# Patient Record
Sex: Male | Born: 1995 | Hispanic: No | Marital: Single | Smoking: Never smoker
Health system: Southern US, Community
[De-identification: ages and names within clinical notes are randomized; demographics above are authoritative.]

## PROBLEM LIST (undated history)

## (undated) DIAGNOSIS — J45990 Exercise induced bronchospasm: Principal | ICD-10-CM

## (undated) DIAGNOSIS — L409 Psoriasis, unspecified: Secondary | ICD-10-CM

## (undated) DIAGNOSIS — M545 Low back pain: Secondary | ICD-10-CM

## (undated) DIAGNOSIS — M4000 Postural kyphosis, site unspecified: Secondary | ICD-10-CM

## (undated) DIAGNOSIS — J45909 Unspecified asthma, uncomplicated: Secondary | ICD-10-CM

## (undated) HISTORY — PX: HX APPENDECTOMY: SHX54

## (undated) HISTORY — DX: Unspecified asthma, uncomplicated: J45.909

## (undated) HISTORY — DX: Exercise induced bronchospasm: J45.990

## (undated) HISTORY — DX: Psoriasis, unspecified: L40.9

## (undated) HISTORY — DX: Low back pain: M54.5

## (undated) HISTORY — DX: Postural kyphosis, site unspecified: M40.00

---
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
## 2014-07-26 ENCOUNTER — Ambulatory Visit (INDEPENDENT_AMBULATORY_CARE_PROVIDER_SITE_OTHER): Payer: No Typology Code available for payment source

## 2014-07-26 ENCOUNTER — Encounter (INDEPENDENT_AMBULATORY_CARE_PROVIDER_SITE_OTHER): Payer: Self-pay

## 2014-07-26 VITALS — BP 140/80 | HR 77 | Temp 97.1°F | Resp 18 | Ht 71.0 in | Wt 196.7 lb

## 2014-07-26 DIAGNOSIS — J40 Bronchitis, not specified as acute or chronic: Principal | ICD-10-CM

## 2014-07-26 MED ORDER — ALBUTEROL SULFATE HFA 90 MCG/ACTUATION AEROSOL INHALER
2.00 | INHALATION_SPRAY | Freq: Four times a day (QID) | RESPIRATORY_TRACT | Status: DC
Start: 2014-07-26 — End: 2014-10-26

## 2014-07-26 MED ORDER — AZITHROMYCIN 250 MG TABLET
ORAL_TABLET | ORAL | Status: DC
Start: 2014-07-26 — End: 2014-10-26

## 2014-07-26 NOTE — Progress Notes (Addendum)
History of Present Illness: Kirk Gonzalez is a 19 y.o. male who [facility] today with chief complaint of    Chief Complaint     Cold Symptoms no relief with OTC    Cough           Pt reports having a cold x 1 month. Pt's symptoms include a productive cough, chest congestion, nasal congestion, and mild SOB. Pt states that this productive cough has improved some but he still has moderate chest congestion. Denies fever, chills, sore throat, ear pain, wheezing, and rash. Pt has been using Vicks Vapor rub, Dayquil, and Nyquil without relief.    Location: Pulmonary  Quality: chest congestion  Onset: 1 month  Severity: mild  Timing: continuing  Context: NKDA. 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 H/o bronchitis.    I reviewed and confirmed the patient's past medical history taken by the nurse or medical assistant with the addition of the following:    Past Medical History:    Past Medical History   Diagnosis Date    Asthma          Past Surgical History:    Past Surgical History   Procedure Laterality Date    Hx appendectomy           Allergies:  No Known Allergies  Medications:    Current Outpatient Prescriptions   Medication Sig    albuterol sulfate (PROVENTIL OR VENTOLIN OR PROAIR) 90 mcg/actuation Inhalation HFA Aerosol Inhaler Take 2 Puffs by inhalation Four times a day    azithromycin (ZITHROMAX) 250 mg Oral Tablet 2 tablets day 1, then 1 tablet daily     Social History:    History   Substance Use Topics    Smoking status: Never Smoker     Smokeless tobacco: Never Used    Alcohol Use: 0.0 oz/week     0 Standard drinks or equivalent per week     Family History: No significant family history.  Family History   Problem Relation Age of Onset    Healthy Mother     Healthy Father          Review of Systems:    General: no fever and no chills  ENT:  Nasal congestion, no sore throat, no otalgia right, and no otalgia left  Pulmonary:   productive cough, chest  congestion, mild SOB, and no wheezing  Skin: no rashes  All other review of systems were negative    Physical Exam:  Vital signs:   Filed Vitals:    07/26/14 1625   BP: 140/80   Pulse: 77   Temp: 36.2 C (97.1 F)   TempSrc: Thermal Scan   Resp: 18   Height: 1.803 m (5\' 11" )   Weight: 89.2 kg (196 lb 10.4 oz)   SpO2: 96%     Body mass index is 27.44 kg/(m^2). 91%ile (Z=1.33) based on CDC 2-20 Years weight-for-age data using vitals from 07/26/2014.  No LMP for male patient.    General:  No acute distress, appears comfortable  Head:  Normocephalic  Eyes:  Normal lids/lashes and normal conjunctiva  ENT:  normal EAC's, normal TM's, MMM, normal oropharynx, no tonsillar exudate or enlargement, normal tongue/uvula  Neck:  supple  Pulmonary:  slight wheezes in the left base, no rales and no rhonchi  Cardiovascular:  regular rate/rhythm and no murmur/rub/gallop  Skin:  warm/dry and no visible rash to exposed skin  Psychiatric:  Appropriate affect and  behavior  Neurologic:   Alert and oriented  Hem/Lymph:  No cervical lymphadenopathy or tenderness     Data Reviewed:    Not applicable    Course: Condition at discharge: Good     Differential Diagnosis: URI v bronchitis v pneumonia     Assessment:   1. Bronchitis        Plan:    Orders Placed This Encounter    azithromycin (ZITHROMAX) 250 mg Oral Tablet    albuterol sulfate (PROVENTIL OR VENTOLIN OR PROAIR) 90 mcg/actuation Inhalation HFA Aerosol Inhaler        Educated on Abx usage.  Pt can use inhaler up to 4 x per day.  If sxs persist past 2-4 weeks RTC for further evaluation.    Go to Emergency Department immediately for further work up if any concerning symptoms.  Plan was discussed and patient verbalized understanding.  If symptoms are worsening or not improving the patient should return for further evaluation.    I am scribing for, and in the presence of, Dr. Margaret Pyle, MD, for services provided on 07/26/2014.  Morgan Stanley, SCRIBE     Morgan Stanley, SCRIBE  07/26/2014, 16:38      I personally performed the services described in this documentation, as scribed  in my presence, and it is both accurate  and complete.    Margaret Pyle, MD

## 2014-07-26 NOTE — Progress Notes (Signed)
Patient has given verbal permission for the student scribe to assist the healthcare provider during the patients visit.Kirk HardingBrent Meganne Rita, LPN 1/61/09607/15/2016, 45:[id number]

## 2014-07-26 NOTE — Patient Instructions (Signed)
[facility]     Operated by [facility]  [address]  [place], [place] [id number]  Phone: [phone]  Fax: [phone]  [email or web address]  [email or web address]   Closed all  Holidays        Attending [facility]    Today's orders:   Orders Placed This Encounter    azithromycin (ZITHROMAX) 250 mg Oral Tablet    albuterol sulfate (PROVENTIL OR VENTOLIN OR PROAIR) 90 mcg/actuation Inhalation HFA Aerosol Inhaler         Prescription(s) E-Rx to:  [id number] - [place], [place] - [address]    Future appts with Student [facility]  Visit date not found    PLEASE ACTIVATE YOUR [place] MYCHART. THIS IS ONE EASY WAY TO COMMUNICATE [facility].  SEE THE CODE ON THIS FORM FOR ACCESS.    -----------------------------PRIVACY INFORMATION-----------------------------------------------  As a [employer] student, regardless of your age, we cannot discuss your personal health information with a parent, spouse, family member or anyone else without your expressed consent.    This policy does not include:  Individuals who would have a legitimate reason to access your records and information to assist in your care under the provisions of HIPAA [facility](Health Insurance Portability and Accountability Act) law;   Individuals with whom you have previously given expressed written consent to do so, such a legal guardian or Power of [address].    No one can access your [facility], unless you give them your account sign on and password. You will receive emails from [facility].  This means anyone who has access to the email account you provided can see this notification. There will be no private medical information included in these emails. This notification of  new medical information  available in your [facility], may be information that you do not want others to know.   _______________________________________________________________________    If your  symptoms persist, worsen or you develop any new or concerning symptoms please call [facility] for at [phone] for a follow up appointment.   If your symptoms are severe, go immediately to the emergency department or call 911.  Please check with your health insurance coverage for these types of visits.   Please keep all follow up visit recommended at today's visit.    If you received x-rays during your visit, be aware that the final and formal interpretation of those films by a radiologist will occur after your discharge.  If there is a significant discrepancy identified after your discharge, we will contact you at the telephone number provided during registration.  These results are available for your review on [facility].    Please refer to your [facility] for lab test results. Lab test results related to HIV, STI's and pregnancy are considered private and are therefore not immediately visible in your [facility], we may still be able to send a generic MyChart message for these results.  If you have cultures pending for sexually transmitted diseases, you will be contacted by phone.     Positive cultures are reported to the [facility] Department of Health, as required by state law. These results are considered private on My[facility] and can only be released by the provider.We will not contact you if the results are negative.    It is very important that we have a phone number that is the single best way to contact you in the event that we become aware of important clinical information or concerns after your discharge.  If the phone number you provided at registration is NOT this number you should inform staff and registration prior to leaving.     Please call [facility] at [phone] with any further questions.    Instructions discussed with patient upon discharge by clinical staff with all questions answered.    Margaret PyleWilliam Audrie Kuri, MD 07/26/2014, 16:37        Bronchitis  Bronchitis is inflammation of the  airways that extend from the windpipe into the lungs (bronchi). The inflammation often causes mucus to develop, which leads to a cough. If the inflammation becomes severe, it may cause shortness of breath.  CAUSES   Bronchitis may be caused by:   1. Viral infections.   2. Bacteria.   3. Cigarette smoke.   4. Allergens, pollutants, and other irritants.   SIGNS AND SYMPTOMS   The most common symptom of bronchitis is a frequent cough that produces mucus. Other symptoms include:  1. Fever.   2. Body aches.   3. Chest congestion.   4. Chills.   5. Shortness of breath.   6. Sore throat.   DIAGNOSIS   Bronchitis is usually diagnosed through a medical history and physical exam. Tests, such as chest X-rays, are sometimes done to rule out other conditions.   TREATMENT   You may need to avoid contact with whatever caused the problem (smoking, for example). Medicines are sometimes needed. These may include:   Antibiotics. These may be prescribed if the condition is caused by bacteria.   Cough suppressants. These may be prescribed for relief of cough symptoms.    Inhaled medicines. These may be prescribed to help open your airways and make it easier for you to breathe.    Steroid medicines. These may be prescribed for those with recurrent (chronic) bronchitis.  HOME CARE INSTRUCTIONS   Get plenty of rest.    Drink enough fluids to keep your urine clear or pale yellow (unless you have a medical condition that requires fluid restriction). Increasing fluids may help thin your secretions and will prevent dehydration.    Only take over-the-counter or prescription medicines as directed by your health care provider.   Only take antibiotics as directed. Make sure you finish them even if you start to feel better.   Avoid secondhand smoke, irritating chemicals, and strong fumes. These will make bronchitis worse. If you are a smoker, quit smoking. Consider using nicotine gum or skin patches to help control withdrawal  symptoms. Quitting smoking will help your lungs heal faster.    Put a cool-mist humidifier in your bedroom at night to moisten the air. This may help loosen mucus. Change the water in the humidifier daily. You can also run the hot water in your shower and sit in the bathroom with the door closed for 5-10 minutes.    Follow up with your health care provider as directed.    Wash your hands frequently to avoid catching bronchitis again or spreading an infection to others.   SEEK MEDICAL CARE IF:  Your symptoms do not improve after 1 week of treatment.   SEEK IMMEDIATE MEDICAL CARE IF:   Your fever increases.   You have chills.    You have chest pain.    You have worsening shortness of breath.    You have bloody sputum.   You faint.   You have lightheadedness.   You have a severe headache.    You vomit repeatedly.  MAKE SURE YOU:    Understand these  instructions.   Will watch your condition.   Will get help right away if you are not doing well or get worse.  Document Released: 12/28/2004 Document Revised: 10/18/2012 Document Reviewed: 08/22/2012  [facility] Patient Information 2015 [place], [place]. This information is not intended to replace advice given to you by your health care provider. Make sure you discuss any questions you have with your health care provider.  How to Use an Inhaler  Proper inhaler technique is very important. Good technique ensures that the medicine reaches the lungs. Poor technique results in depositing the medicine on the tongue and back of the throat rather than in the airways. If you do not use the inhaler with good technique, the medicine will not help you.  STEPS TO FOLLOW IF USING AN INHALER WITHOUT AN EXTENSION TUBE  5. Remove the cap from the inhaler.  6. If you are using the inhaler for the first time, you will need to prime it. Shake the inhaler for 5 seconds and release four puffs into the air, away from your face. Ask your health care provider or pharmacist if  you have questions about priming your inhaler.  7. Shake the inhaler for 5 seconds before each breath in (inhalation).  8. Position the inhaler so that the top of the canister faces up.  9. Put your index finger on the top of the medicine canister. Your thumb supports the bottom of the inhaler.  10. Open your mouth.  11. Either place the inhaler between your teeth and place your lips tightly around the mouthpiece, or hold the inhaler 1-2 inches away from your open mouth. If you are unsure of which technique to use, ask your health care provider.  12. Breathe out (exhale) normally and as completely as possible.  13. Press the canister down with your index finger to release the medicine.  14. At the same time as the canister is pressed, inhale deeply and slowly until your lungs are completely filled. This should take 4-6 seconds. Keep your tongue down.  15. Hold the medicine in your lungs for 5-10 seconds (10 seconds is best). This helps the medicine get into the small airways of your lungs.  16. Breathe out slowly, through pursed lips. Whistling is an example of pursed lips.  17. Wait at least 15-30 seconds between puffs. Continue with the above steps until you have taken the number of puffs your health care provider has ordered. Do not use the inhaler more than your health care provider tells you.  18. Replace the cap on the inhaler.  19. Follow the directions from your health care provider or the inhaler insert for cleaning the inhaler.  STEPS TO FOLLOW IF USING AN INHALER WITH AN EXTENSION (SPACER)  7. Remove the cap from the inhaler.  8. If you are using the inhaler for the first time, you will need to prime it. Shake the inhaler for 5 seconds and release four puffs into the air, away from your face. Ask your health care provider or pharmacist if you have questions about priming your inhaler.  9. Shake the inhaler for 5 seconds before each breath in (inhalation).  10. Place the open end of the spacer onto the  mouthpiece of the inhaler.  11. Position the inhaler so that the top of the canister faces up and the spacer mouthpiece faces you.  12. Put your index finger on the top of the medicine canister. Your thumb supports the bottom of the inhaler and the spacer.  13. Breathe out (exhale) normally and as completely as possible.  14. Immediately after exhaling, place the spacer between your teeth and into your mouth. Close your lips tightly around the spacer.  15. Press the canister down with your index finger to release the medicine.  16. At the same time as the canister is pressed, inhale deeply and slowly until your lungs are completely filled. This should take 4-6 seconds. Keep your tongue down and out of the way.  17. Hold the medicine in your lungs for 5-10 seconds (10 seconds is best). This helps the medicine get into the small airways of your lungs. Exhale.  18. Repeat inhaling deeply through the spacer mouthpiece. Again hold that breath for up to 10 seconds (10 seconds is best). Exhale slowly. If it is difficult to take this second deep breath through the spacer, breathe normally several times through the spacer. Remove the spacer from your mouth.  19. Wait at least 15-30 seconds between puffs. Continue with the above steps until you have taken the number of puffs your health care provider has ordered. Do not use the inhaler more than your health care provider tells you.  20. Remove the spacer from the inhaler, and place the cap on the inhaler.  21. Follow the directions from your health care provider or the inhaler insert for cleaning the inhaler and spacer.  If you are using different kinds of inhalers, use your quick relief medicine to open the airways 10-15 minutes before using a steroid if instructed to do so by your health care provider. If you are unsure which inhalers to use and the order of using them, ask your health care provider, nurse, or respiratory therapist.  If you are using a steroid inhaler,  always rinse your mouth with water after your last puff, then gargle and spit out the water. Do not swallow the water.  AVOID:   Inhaling before or after starting the spray of medicine. It takes practice to coordinate your breathing with triggering the spray.   Inhaling through the nose (rather than the mouth) when triggering the spray.  HOW TO DETERMINE IF YOUR INHALER IS FULL OR NEARLY EMPTY  You cannot know when an inhaler is empty by shaking it. A few inhalers are now being made with dose counters. Ask your health care provider for a prescription that has a dose counter if you feel you need that extra help. If your inhaler does not have a counter, ask your health care provider to help you determine the date you need to refill your inhaler. Write the refill date on a calendar or your inhaler canister. Refill your inhaler 7-10 days before it runs out. Be sure to keep an adequate supply of medicine. This includes making sure it is not expired, and that you have a spare inhaler.   SEEK MEDICAL CARE IF:    Your symptoms are only partially relieved with your inhaler.   You are having trouble using your inhaler.   You have some increase in phlegm.  SEEK IMMEDIATE MEDICAL CARE IF:    You feel little or no relief with your inhalers. You are still wheezing and are feeling shortness of breath or tightness in your chest or both.   You have dizziness, headaches, or a fast heart rate.   You have chills, fever, or night sweats.   You have a noticeable increase in phlegm production, or there is blood in the phlegm.  MAKE SURE YOU:    Understand  these instructions.   Will watch your condition.   Will get help right away if you are not doing well or get worse.     This information is not intended to replace advice given to you by your health care provider. Make sure you discuss any questions you have with your health care provider.     Document Released: 12/26/1999 Document Revised: 10/18/2012 Document Reviewed:  07/27/2012  Elsevier Interactive Patient Education [employer]

## 2014-10-26 ENCOUNTER — Encounter (INDEPENDENT_AMBULATORY_CARE_PROVIDER_SITE_OTHER): Payer: Self-pay

## 2014-10-26 ENCOUNTER — Ambulatory Visit (INDEPENDENT_AMBULATORY_CARE_PROVIDER_SITE_OTHER): Payer: No Typology Code available for payment source

## 2014-10-26 VITALS — BP 120/77 | HR 85 | Temp 97.4°F | Resp 16 | Ht 71.0 in | Wt 192.8 lb

## 2014-10-26 DIAGNOSIS — J029 Acute pharyngitis, unspecified: Secondary | ICD-10-CM

## 2014-10-26 MED ORDER — AZITHROMYCIN 250 MG TABLET
ORAL_TABLET | ORAL | Status: DC
Start: 2014-10-26 — End: 2014-11-29

## 2014-10-26 NOTE — Patient Instructions (Addendum)
[facility] [employer]      Operated by [facility]  [address] [place], [place] [address]  Phone: [phone])  Fax: [phone]  [email or web address]  Open Daily 8:00a - 8:00p    Closed Thanksgiving and Christmas Day  [facility]     Operated by [facility]  [address]  [facility]  [place], [place] [address]  Phone: [phone]  Fax: [phone]  [email or web address]  Open M-F  8:00a - 8:00p  Sat              10:00a - 4:00p  Sun             Closed  Closed all [place] Holidays        Attending Caregiver: Marcia Brash, NP      Today's orders:   Orders Placed This Encounter    azithromycin (ZITHROMAX) 250 mg Oral Tablet      Gargle with warm salt water.     Prescription(s) E-Rx to:  [facility] - [place],  - [address]    ________________________________________________________________________  Short Term Disability and Family Medical Leave Act  [facility] does NOT provide assistance with any disability applications.  If you feel your medical condition requires you to be on disability, you will need to follow up with  your primary care physician or a specialist.  We apologize for any inconvenience.    For Medication Prescribed by [facility]:  As an Urgent Care facility, our clinic does NOT offer prescription refills over the telephone.    If you need more of the medication one of our medical providers prescribed, you will  either need to be re-evaluated by [place] or see your primary care physician.    ________________________________________________________________________      It is very important that we have a phone number.  This is the single best way to contact you in the event that we become aware of important clinical information or concerns after your discharge.  If the phone number you provided at registration is NOT this number you should inform staff and registration  prior to leaving.      Your treatment and evaluation today was focused on identifying and treating potentially emergent conditions based on your presenting signs, symptoms, and history.  The resulting initial clinical impression and treatment plan is not intended to be definitive or a substitute for a full physical examination and evaluation by your primary care provider.  If your symptoms persist, worsen, or you develop any new or concerning symptoms, you need to be evaluated.      If you received x-rays during your visit, be aware that the final and formal interpretation of those films by a radiologist may occur after your discharge.  If there is a significant discrepancy identified after your discharge, we will contact you at the telephone number provided at registration.      If you received a pelvic exam, you may have cultures pending for sexually transmitted diseases.  Positive cultures are reported to the [facility] Department of Health as required by state law.  You may contact the Health Information Management Office of [facility] to get a copy of your results.     If you are over 56 year old, we cannot discuss your personal health information with a parent, spouse, family member, or anyone else without your consent.  This does not include those who have legitimate access to  your records and information to assist in your care under the provisions of HIPAA [employer](Health Insurance Portability and Accountability Act) law, or those to whom you have previously given written consent to do so, such a legal guardian or Power of [place] HouseAttorney.      Instructions are discussed with patient upon discharge by clinical staff with all questions answered.  Please call [facility] [phone]) if any further questions develop.  Go immediately to the emergency department if any concerns or worsening symptoms.      Marcia BrashAmy Geraldene Eisel, NP 10/26/2014, 12:36    Pharyngitis  Pharyngitis is a sore throat (pharynx). There is redness,  pain, and swelling of your throat.  HOME CARE    Drink enough fluids to keep your pee (urine) clear or pale yellow.   Only take medicine as told by your doctor.   You may get sick again if you do not take medicine as told. Finish your medicines, even if you start to feel better.   Do not take aspirin.   Rest.   Rinse your mouth (gargle) with salt water ( tsp of salt per 1 qt of water) every 1-2 hours. This will help the pain.   If you are not at risk for choking, you can suck on hard candy or sore throat lozenges.  GET HELP IF:   You have large, tender lumps on your neck.   You have a rash.   You cough up green, yellow-brown, or bloody spit.  GET HELP RIGHT AWAY IF:    You have a stiff neck.   You drool or cannot swallow liquids.   You throw up (vomit) or are not able to keep medicine or liquids down.   You have very bad pain that does not go away with medicine.   You have problems breathing (not from a stuffy nose).  MAKE SURE YOU:    Understand these instructions.   Will watch your condition.   Will get help right away if you are not doing well or get worse.     This information is not intended to replace advice given to you by your health care provider. Make sure you discuss any questions you have with your health care provider.     Document Released: 06/16/2007 Document Revised: 10/18/2012 Document Reviewed: 09/04/2012  Elsevier Interactive Patient Education [employer] Elsevier Inc.

## 2014-10-26 NOTE — Progress Notes (Addendum)
Attending Physician: Dr. Luz BrazenHartman, MD,    History of Present Illness: Kirk Gonzalez is a 19 y.o. male who [facility] today with chief complaint of    Chief Complaint     Cough,Cold,Sore Throat             Pt presents today with a CC of a sore throat x 2 weeks. Associated sxs of an intermittent productive cough, rhinorrhea, congestion, mild SOB, and sinus pressure (resolved). Pt states when he coughs, he is sometime producing a green mucous. Pt has had positive sick contact recently. Attempted tx of Nyquil and Dayquil with no relief. Denies n/v/d, fevers, chills, and all other signs and sxs.     I reviewed and confirmed the patient's past medical history taken by the nurse or medical assistant with the addition of the following:    Past Medical History:    Past Medical History   Diagnosis Date    Asthma      Past Surgical History:    Past Surgical History   Procedure Laterality Date    Hx appendectomy       Allergies:  Allergies   Allergen Reactions    Nkda [No Known Drug Allergies]         Medications:    Current Outpatient Prescriptions   Medication Sig    azithromycin (ZITHROMAX) 250 mg Oral Tablet Take 500 mg (2 tab) on day 1; take 250 mg (1 tab) on days 2-5.     Social History:    Social History   Substance Use Topics    Smoking status: Never Smoker     Smokeless tobacco: Never Used    Alcohol Use: 0.0 oz/week     0 Standard drinks or equivalent per week     Family History: No significant family history.  Family History   Problem Relation Age of Onset    Healthy Mother     Healthy Father      Review of Systems:  General: no fever, no chills  ENT: sore throat, rhinorrhea, congestion, sinus pressure (resolved)    Pulmonary: intermittent productive cough, mild SOB   GI: no nausea, no vomiting, no diarrhea  All other review of systems were negative    Physical Exam:  Vital signs:   Filed Vitals:    10/26/14 1228   BP: 120/77   Pulse: 85   Temp: 36.3 C (97.4 F)   TempSrc: Thermal Scan   Resp:  16   Height: 1.803 m (5\' 11" )   Weight: 87.454 kg (192 lb 12.8 oz)   SpO2: 95%     Body mass index is 26.9 kg/(m^2). Normalized weight-for-age data available only for age 11 to 20 years.  No LMP for male patient.    General:  No acute distress, appears comfortable  Head:  Normocephalic. Atraumatic.   Eyes:  Normal lids/lashes and normal conjunctiva  ENT:  normal EAC's, normal TM's, MMM, erythematous oropharynx, normal tongue/uvula, no frontal/maxillary/ethmoid sinus tenderness  Neck:  supple  Pulmonary:  clear to auscultation bilaterally, no wheezes, no rales and no rhonchi  Cardiovascular:  regular rate/rhythm and no murmur/rub/gallop  Skin:  warm/dry and no visible rash to exposed skin  Psychiatric:  Appropriate affect and behavior  Neurologic:   Alert and oriented  Hem/Lymph: Anterior cervical lymphadenopathy and tenderness     Data Reviewed: Not applicable    Course: Condition at discharge: Good     Differential Diagnosis:  pharyngitis vs tonsillitis vs sinusitis  Assessment:   1. Pharyngitis, unspecified etiology      Plan:    Orders Placed This Encounter    azithromycin (ZITHROMAX) 250 mg Oral Tablet     Pt advised to take Zithromax as directed, and was educated on symptomatic treatment (warm salt water gargles). See patient discharge instructions given.     Go to Emergency Department immediately for further work up if any concerning symptoms.  Plan was discussed and patient verbalized understanding.  If symptoms are worsening or not improving the patient should return for further evaluation.    I am scribing for, and in the presence of, Marcia Brash, APRN, for services provided on 10/26/2014.  Alene Mires, SCRIBE     Alene Mires, SCRIBE  10/26/2014, 12:28    I personally performed the services described in this documentation, as scribed  in my presence, and it is both accurate  and complete.    Marcia Brash, NP    The co-signing faculty was physically present in Urgent Care and available for consultation  and did not particpate in the care of this patient.    Marcia Brash, NP  10/26/2014, 12:43

## 2014-11-29 ENCOUNTER — Encounter (INDEPENDENT_AMBULATORY_CARE_PROVIDER_SITE_OTHER): Payer: Self-pay | Admitting: Emergency Medicine

## 2014-11-29 ENCOUNTER — Ambulatory Visit (INDEPENDENT_AMBULATORY_CARE_PROVIDER_SITE_OTHER): Payer: No Typology Code available for payment source | Admitting: Emergency Medicine

## 2014-11-29 VITALS — BP 119/74 | HR 94 | Temp 98.5°F | Resp 16 | Ht 71.0 in | Wt 189.9 lb

## 2014-11-29 DIAGNOSIS — L600 Ingrowing nail: Secondary | ICD-10-CM

## 2014-11-29 MED ORDER — CEPHALEXIN 500 MG CAPSULE
500.00 mg | ORAL_CAPSULE | Freq: Three times a day (TID) | ORAL | Status: AC
Start: 2014-11-29 — End: 2014-12-06

## 2014-11-29 NOTE — Progress Notes (Signed)
History of Present Illness: Kirk Gonzalez is a 19 y.o. male who presents to the clinic today with chief complaint of    Chief Complaint     Ingrown Nail             Patient presents for evaluation of left 3rd toenail. Pain on the medial aspect of the toe. No pus or bleeding at that time. He reports trying to remove part of the nail himself 2 nights ago to relieve the pressure/pain, but woke up this morning with purulent drainage. No fevers or chills. Some pain persists but not as bad as before. No other attempted interventions. No other concerns or comments otherwise. Has had an ingrown nail partly removed on another toe before.    I reviewed and confirmed the patient's past medical history taken by the nurse or medical assistant with the addition of the following:    Past Medical History:    Past Medical History   Diagnosis Date    Asthma          Past Surgical History:    Past Surgical History   Procedure Laterality Date    Hx appendectomy           Allergies:  Allergies   Allergen Reactions    Nkda [No Known Drug Allergies]      Medications:    No current outpatient prescriptions on file.     Social History:    Social History   Substance Use Topics    Smoking status: Never Smoker     Smokeless tobacco: Never Used    Alcohol Use: 0.0 oz/week     0 Standard drinks or equivalent per week     Family History: No significant family history.  Family History   Problem Relation Age of Onset    Healthy Mother     Healthy Father        Review of Systems:  All other ROS otherwise negative except as per HPI.      Physical Exam:  Vital signs:   Filed Vitals:    11/29/14 1142   BP: 119/74   Pulse: 94   Temp: 36.9 C (98.5 F)   TempSrc: Thermal Scan   Resp: 16   Height: 1.803 m ( )   Weight: 86.138 kg (189 lb 14.4 oz)   SpO2: 99%     Body mass index is 26.5 kg/(m^2). Normalized weight-for-age data available only for age 36 to 20 years.  No LMP for male patient.    General:  Well appearing and No acute distress  Head:   Normocephalic and Atraumatic  Pulmonary:  clear to auscultation bilaterally, no wheezes, no rales, no rhonchi, no retraction and no stridor  Cardiovascular:  regular rate/rhythm, normal S1/S2 and no murmur/rub/gallop  Musculoskeletal:  Normal sensation and strength left toes with trace medial erythema and TTP of 3rd digit, with nails cut short on most toes; no paronychia seen  Skin:  warm/dry and no rash  Psychiatric:  Appropriate affect and behavior and Normal speech  Neurologic:   Alert and oriented x 3 and no focal neurologic deficits    Data Reviewed:    Not applicable    Course: Condition at discharge: Good     Differential Diagnosis: ingrown 3rd left toenail +/- paronychia    Assessment:   1. Ingrown toenail        Plan:    Mildly-irritated and tender left 3rd digit secondary to ingrown nail, which patient attempted to partially remove.  Encouraged soaks BID-TID and 7 day course of Keflex, given purulent drainage. RTC 1-2 weeks if still tender/erythematous or other concerns. Would plan to remove complete 3rd nail at that time (rather than wedge resection) if not improving. No apparent paronychia today, though.  Orders Placed This Encounter    cephalexin (KEFLEX) 500 mg Oral Capsule          Go to Emergency Department immediately for further work up if any concerning symptoms.  Plan was discussed and patient verbalized understanding.  If symptoms are worsening or not improving the patient should return to the Urgent Care for further evaluation.    Alessandra GroutBenjamin Travus Oren, MD 11/29/2014, 11:41

## 2014-11-29 NOTE — Patient Instructions (Addendum)
[facility]     [facility] by [facility]  [address]  [place], [place] [id number]  Phone: [phone]  Fax: [phone]  [email or web address]  Twitter [email or web address]   Closed all  Holidays      Thank you for choosing us to provide your medical care today.  Please seek reevaluation if your symptoms persist or for any other concerns.    Attending Caregiver: Alessandra GroutBenjamin Latroya Ng, MD    Today's orders:   Orders Placed This Encounter    cephalexin (KEFLEX) 500 mg Oral Capsule         Prescription(s) E-Rx to:  [facility] - [place], [place] - [address]    Future appts with Student Health  Visit date not found    PLEASE ACTIVATE YOUR [facility] [place] MYCHART. THIS IS ONE EASY WAY TO COMMUNICATE WITH [facility].  SEE THE CODE ON THIS FORM FOR ACCESS.    -----------------------------PRIVACY INFORMATION-----------------------------------------------  As a [employer] student, regardless of your age, we cannot discuss your personal health information with a parent, spouse, family member or anyone else without your expressed consent.    This policy does not include:  Individuals who would have a legitimate reason to access your records and information to assist in your care under the provisions of HIPAA [facility](Health Insurance Portability and Accountability Act) law;   Individuals with whom you have previously given expressed written consent to do so, such a legal guardian or Power of [address].    No one can access your [facility], unless you give them your account sign on and password. You will receive emails from [facility].  This means anyone who has access to the email account you provided can see this notification. There will be no private medical information included in these emails. This notification of  new medical information  available in your [facility], may be information that you do not want others to know.      _______________________________________________________________________    If your symptoms persist, worsen or you develop any new or concerning symptoms please call  Student Health for at [phone] for a follow up appointment.   If your symptoms are severe, go immediately to the emergency department or call 911.  Please check with your health insurance coverage for these types of visits.   Please keep all follow up visit recommended at today's visit.    If you received x-rays during your visit, be aware that the final and formal interpretation of those films by a radiologist will occur after your discharge.  If there is a significant discrepancy identified after your discharge, we will contact you at the telephone number provided during registration.  These results are available for your review on [facility].    Please refer to your [facility] for lab test results. Lab test results related to HIV, STI's and pregnancy are considered private and are therefore not immediately visible in your [facility], we may still be able to send a generic MyChart message for these results.  If you have cultures pending for sexually transmitted diseases, you will be contacted by phone.     Positive cultures are reported to the [facility] Department of Health, as required by state law. These results are considered private on [facility] and can only be released by the provider.We will not contact you if the results are negative.    It is very important that we have a phone number that is the single best way to contact you in the event that we become aware  of important clinical information or concerns after your discharge.  If the phone number you provided at registration is NOT this number you should inform staff and registration prior to leaving.     Please call [facility] at [phone] with any further questions.    Instructions discussed with patient upon discharge by clinical staff with all questions answered.     Alessandra Grout, MD 11/29/2014, 11:54        Infected Ingrown Toenail  An infected ingrown toenail occurs when the nail edge grows into the skin and bacteria invade the area. Symptoms include pain, tenderness, swelling, and pus drainage from the edge of the nail. Poorly fitting shoes, minor injuries, and improper cutting of the toenail may also contribute to the problem. You should cut your toenails squarely instead of rounding the edges. Do not cut them too short. Avoid tight or pointed toe shoes. Sometimes the ingrown portion of the nail must be removed. If your toenail is removed, it can take 3-4 months for it to re-grow.  HOME CARE INSTRUCTIONS    Soak your infected toe in warm water for 20-30 minutes, 2 to 3 times a day.   Packing or dressings applied to the area should be changed daily.   Take medicine as directed and finish them.   Reduce activities and keep your foot elevated when able to reduce swelling and discomfort. Do this until the infection gets better.   Wear sandals or go barefoot as much as possible while the infected area is sensitive.   See your caregiver for follow-up care in 2-3 days if the infection is not better.  SEEK MEDICAL CARE IF:   Your toe is becoming more red, swollen or painful.  MAKE SURE YOU:    Understand these instructions.   Will watch your condition.   Will get help right away if you are not doing well or get worse.  Document Released: 02/05/2004 Document Revised: 03/22/2011 Document Reviewed: 12/25/2007  [facility] Patient Information 2015 [place], [place]. This information is not intended to replace advice given to you by your health care provider. Make sure you discuss any questions you have with your health care provider.

## 2014-12-17 ENCOUNTER — Ambulatory Visit (INDEPENDENT_AMBULATORY_CARE_PROVIDER_SITE_OTHER): Payer: No Typology Code available for payment source

## 2014-12-17 ENCOUNTER — Encounter (INDEPENDENT_AMBULATORY_CARE_PROVIDER_SITE_OTHER): Payer: Self-pay

## 2014-12-17 VITALS — BP 120/86 | HR 77 | Temp 97.0°F | Resp 18 | Ht 71.0 in | Wt 193.0 lb

## 2014-12-17 DIAGNOSIS — J069 Acute upper respiratory infection, unspecified: Secondary | ICD-10-CM

## 2014-12-17 DIAGNOSIS — B9789 Other viral agents as the cause of diseases classified elsewhere: Principal | ICD-10-CM

## 2014-12-17 MED ORDER — FLUTICASONE PROPIONATE 50 MCG/ACTUATION NASAL SPRAY,SUSPENSION
1.0000 | Freq: Every day | NASAL | Status: DC
Start: 2014-12-17 — End: 2015-07-25

## 2014-12-17 MED ORDER — FEXOFENADINE-PSEUDOEPHEDRINE ER 180 MG-240 MG TABLET,EXT.RELEASE 24 HR
1.00 | ORAL_TABLET | Freq: Every day | ORAL | Status: DC | PRN
Start: 2014-12-17 — End: 2015-07-25

## 2014-12-17 NOTE — Progress Notes (Addendum)
Attending Physician: Dr. Edwyna Shell  History of Present Illness: Kirk Gonzalez is a 19 y.o. male who presents to the Urgent Care/ [facility] today with chief complaint of    Chief Complaint     Cough     Congestion     Sore Throat             Location: ENT  Quality: cough  Onset: x1 week   Severity: mild  Timing: continuing  Context: Pt presents with c/o a cough. Pt states he has been having a cough over the past week with accompanying sinus congestion. Within the past couple of days, his throat has been hurting and he has pain with swallowing. Pt states the sinus congestion has improved slightly, however he is still having an intermittently productive cough and sore throat. Pt has taken DayQuil and NyQuil for sx with mild relief as well has vicks vaporub. Pt has a past hx of exercise induced asthma. NKDA. Pt is a non smoker per chart review.   Associated symptoms: cough, congestion, sore throat  Denies: fever, chills, wheezing, SOB    I reviewed and confirmed the patient's past medical history taken by the nurse or medical assistant with the addition of the following:    Past Medical History:    Past Medical History   Diagnosis Date    Asthma          Past Surgical History:    Past Surgical History   Procedure Laterality Date    Hx appendectomy           Allergies:  Allergies   Allergen Reactions    Nkda [No Known Drug Allergies]      Medications:    Current Outpatient Prescriptions   Medication Sig    Fexofenadine-Pseudoephedrine (ALLEGRA-D 24 HOUR) 180-240 mg Oral Tablet Sustained Release 24 hr Take 1 Tab by mouth Once per day as needed    fluticasone (FLONASE) 50 mcg/actuation Nasal Spray, Suspension 1 Spray by Each Nostril route Once a day     Social History:    Social History   Substance Use Topics    Smoking status: Never Smoker     Smokeless tobacco: Never Used    Alcohol Use: 0.0 oz/week     0 Standard drinks or equivalent per week     Family History: No significant family history.  Family History      Problem Relation Age of Onset    Healthy Mother     Healthy Father          Review of Systems:    General: no fever and no chills  ENT:  sore throat and sinus congestion  Pulmonary:   dry cough, productive cough, no wheezing and no SOB  All other review of systems were negative    Physical Exam:  Vital signs:   Filed Vitals:    12/17/14 1033   BP: 120/86   Pulse: 77   Temp: 36.1 C (97 F)   TempSrc: Oral   Resp: 18   Height: 1.803 m ( )   Weight: 87.544 kg (193 lb)   SpO2: 97%     Body mass index is 26.93 kg/(m^2). Normalized weight-for-age data available only for age 1 to 20 years.  No LMP for male patient.      General:  Well appearing, NAD  Head:  Normocephalic, Atraumatic   Eyes:  Normal lids/lashes and normal conjunctiva  ENT:   normal EAC's, fluid behind bilateralTM's,   nasal MMM,  injected and edematous turbinates   no tonsillar enlargement, erythema with postnasal drip, no exudate  Neck:  supple  Pulmonary:  Normal respiratory effort, good air exchange in all lung fields, no wheezes, no rales and no rhonchi  Cardiovascular:  regular rate/rhythm,   Skin:  Warm/dry, intact, normal color, no rash on exposed skin  Psychiatric:  Appropriate affect and behavior  Neurologic:   Alert and oriented x3. Normal coordination and gait.   Hem/Lymph:  No lymphadenopathy    Data Reviewed:    Not applicable    Course: Condition at discharge: Good     Differential Diagnosis: allergic rhinitis vs. Postnasal drip vs. Sinusitis vs. URI     Assessment:   1. Viral URI with cough        Plan:    Orders Placed This Encounter    Fexofenadine-Pseudoephedrine (ALLEGRA-D 24 HOUR) 180-240 mg Oral Tablet Sustained Release 24 hr    fluticasone (FLONASE) 50 mcg/actuation Nasal Spray, Suspension          Pt was prescribed Allegra-D, flonase to take as directed and was educated on proper prescription use.   Pt was educated on symptomatic management with OTC and home remedies.  Pt was advised to stay well hydrated and well  rested.  Pt was advised to try warm salt water gargles for sx and/or warm tea with honey.   See patient discharge instructions given.       Go to Emergency Department immediately for further work up if any concerning symptoms develop.  Plan was discussed and patient verbalized understanding.  If symptoms are worsening or not improving the patient should return to the Urgent Care/ Student [facility] for further evaluation.    I am scribing for, and in the presence of, Theresia MajorsBeth Beck, DNP, FNP for services provided on 12/17/2014  [facility]ole Halverstadt, SCRIBE   Ansel Bongicole Halverstadt, SCRIBE 12/17/2014, 10:35    I personally performed the services described in this documentation, as scribed  in my presence, and it is both accurate  and complete.    Theresia MajorsBeth Beck, DNP,FNP  The supervising physician was physically present in Urgent Care/student health and available for consultation, and did not participate in the care of this patient.

## 2014-12-17 NOTE — Patient Instructions (Signed)
Antibiotic Nonuse   Your caregiver felt that the infection or problem was not one that would be helped with an antibiotic.  Infections may be caused by viruses or bacteria. Only a caregiver can tell which one of these is the likely cause of an illness. A cold is the most common cause of infection in both adults and children. A cold is a virus. Antibiotic treatment will have no effect on a viral infection. Viruses can lead to many lost days of work caring for sick children and many missed days of school. Children may catch as many as 10 "colds" or "flus" per year during which they can be tearful, cranky, and uncomfortable. The goal of treating a virus is aimed at keeping the ill person comfortable.  Antibiotics are medications used to help the body fight bacterial infections. There are relatively few types of bacteria that cause infections but there are hundreds of viruses. While both viruses and bacteria cause infection they are very different types of germs. A viral infection will typically go away by itself within 7 to 10 days. Bacterial infections may spread or get worse without antibiotic treatment.  Examples of bacterial infections are:  · Sore throats (like strep throat or tonsillitis).  · Infection in the lung (pneumonia).  · Ear and skin infections.  Examples of viral infections are:  · Colds or flus.  · Most coughs and bronchitis.  · Sore throats not caused by Strep.  · Runny noses.  It is often best not to take an antibiotic when a viral infection is the cause of the problem. Antibiotics can kill off the helpful bacteria that we have inside our body and allow harmful bacteria to start growing. Antibiotics can cause side effects such as allergies, nausea, and diarrhea without helping to improve the symptoms of the viral infection. Additionally, repeated uses of antibiotics can cause bacteria inside of our body to become resistant. That resistance can be passed onto harmful bacterial. The next time you have  an infection it may be harder to treat if antibiotics are used when they are not needed. Not treating with antibiotics allows our own immune system to develop and take care of infections more efficiently. Also, antibiotics will work better for us when they are prescribed for bacterial infections.  Treatments for a child that is ill may include:  · Give extra fluids throughout the day to stay hydrated.  · Get plenty of rest.  · Only give your child over-the-counter or prescription medicines for pain, discomfort, or fever as directed by your caregiver.  · The use of a cool mist humidifier may help stuffy noses.  · Cold medications if suggested by your caregiver.  Your caregiver may decide to start you on an antibiotic if:  · The problem you were seen for today continues for a longer length of time than expected.  · You develop a secondary bacterial infection.  SEEK MEDICAL CARE IF:  · Fever lasts longer than 5 days.  · Symptoms continue to get worse after 5 to 7 days or become severe.  · Difficulty in breathing develops.  · Signs of dehydration develop (poor drinking, rare urinating, dark colored urine).  · Changes in behavior or worsening tiredness (listlessness or lethargy).  Document Released: 03/08/2001 Document Revised: 03/22/2011 Document Reviewed: 09/04/2008  ExitCare® Patient Information ©2015 [employer]. This information is not intended to replace advice given to you by your health care provider. Make sure you discuss any questions you have with your   health care provider.  Viral Infections  A viral infection can be caused by different types of viruses.Most viral infections are not serious and resolve on their own. However, some infections may cause severe symptoms and may lead to further complications.  SYMPTOMS  Viruses can frequently cause:   Minor sore throat.   Aches and pains.   Headaches.   Runny nose.   Different types of rashes.   Watery eyes.   Tiredness.   Cough.   Loss of  appetite.   Gastrointestinal infections, resulting in nausea, vomiting, and diarrhea.  These symptoms do not respond to antibiotics because the infection is not caused by bacteria. However, you might catch a bacterial infection following the viral infection. This is sometimes called a "superinfection." Symptoms of such a bacterial infection may include:   Worsening sore throat with pus and difficulty swallowing.   Swollen neck glands.   Chills and a high or persistent fever.   Severe headache.   Tenderness over the sinuses.   Persistent overall ill feeling (malaise), muscle aches, and tiredness (fatigue).   Persistent cough.   Yellow, green, or brown mucus production with coughing.  HOME CARE INSTRUCTIONS    Only take over-the-counter or prescription medicines for pain, discomfort, diarrhea, or fever as directed by your caregiver.   Drink enough water and fluids to keep your urine clear or pale yellow. Sports drinks can provide valuable electrolytes, sugars, and hydration.   Get plenty of rest and maintain proper nutrition. Soups and broths with crackers or rice are fine.  SEEK IMMEDIATE MEDICAL CARE IF:    You have severe headaches, shortness of breath, chest pain, neck pain, or an unusual rash.   You have uncontrolled vomiting, diarrhea, or you are unable to keep down fluids.   You or your child has an oral temperature above 102 F (38.9 C), not controlled by medicine.   Your baby is older than 3 months with a rectal temperature of 102 F (38.9 C) or higher.   Your baby is 59 months old or younger with a rectal temperature of 100.4 F (38 C) or higher.  MAKE SURE YOU:    Understand these instructions.   Will watch your condition.   Will get help right away if you are not doing well or get worse.     This information is not intended to replace advice given to you by your health care provider. Make sure you discuss any questions you have with your health care provider.     Document Released:  10/07/2004 Document Revised: 03/22/2011 Document Reviewed: 05/04/2010  Elsevier Interactive Patient Education 2016 [place] may help relieve the symptoms of a cough and cold. They add moisture to the air, which helps mucus to become thinner and less sticky. This makes it easier to breathe and cough up secretions. Cool mist vaporizers do not cause serious burns like hot mist vaporizers, which may also be called steamers or humidifiers. Vaporizers have not been proven to help with colds. You should not use a vaporizer if you are allergic to mold.  HOME CARE INSTRUCTIONS   Follow the package instructions for the vaporizer.   Do not use anything other than distilled water in the vaporizer.   Do not run the vaporizer all of the time. This can cause mold or bacteria to grow in the vaporizer.   Clean the vaporizer after each time it is used.   Clean and dry the vaporizer well  before storing it.   Stop using the vaporizer if worsening respiratory symptoms develop.     This information is not intended to replace advice given to you by your health care provider. Make sure you discuss any questions you have with your health care provider.     Document Released: 09/25/2003 Document Revised: 01/02/2013 Document Reviewed: 05/17/2012  Elsevier Interactive Patient Education 2016 [employer].     [facility]      Operated by [facility]  [address] [place], [place] [address]  Phone: [phone])  Fax: [phone]  [email or web address]  Open Daily 8:00a - 8:00p    Closed Thanksgiving and Christmas Day  [facility]     Operated by [facility]  [address]  [facility] and Education Building  [place], [place] [address]  Phone: [phone])  Fax: [phone]  [email or web address]  Open M-F  8:00a - 8:00p  Sat              10:00a - 4:00p  Sun             Closed  Closed all [place] Holidays         Attending Caregiver: Theresia Majors, FNP      Today's orders:   Orders Placed This Encounter    Fexofenadine-Pseudoephedrine (ALLEGRA-D 24 HOUR) 180-240 mg Oral Tablet Sustained Release 24 hr    fluticasone (FLONASE) 50 mcg/actuation Nasal Spray, Suspension        Prescription(s) E-Rx to:  [facility] - [place], [place] - [address]    ________________________________________________________________________  Short Term Disability and Family Medical Leave Act   Urgent Care does NOT provide assistance with any disability applications.  If you feel your medical condition requires you to be on disability, you will need to follow up with  your primary care physician or a specialist.  We apologize for any inconvenience.    For Medication Prescribed by [facility]:  As an Urgent Care facility, our clinic does NOT offer prescription refills over the telephone.    If you need more of the medication one of our medical providers prescribed, you will  either need to be re-evaluated by [place] or see your primary care physician.    ________________________________________________________________________      It is very important that we have a phone number.  This is the single best way to contact you in the event that we become aware of important clinical information or concerns after your discharge.  If the phone number you provided at registration is NOT this number you should inform staff and registration prior to leaving.      Your treatment and evaluation today was focused on identifying and treating potentially emergent conditions based on your presenting signs, symptoms, and history.  The resulting initial clinical impression and treatment plan is not intended to be definitive or a substitute for a full physical examination and evaluation by your primary care provider.  If your symptoms persist, worsen, or you develop any new or concerning symptoms, you need to be evaluated.      If you received x-rays  during your visit, be aware that the final and formal interpretation of those films by a radiologist may occur after your discharge.  If there is a significant discrepancy identified after your discharge, we will contact you at the telephone number provided at registration.      If you received a pelvic exam, you may have cultures  pending for sexually transmitted diseases.  Positive cultures are reported to the [place] [place] Department of Health as required by state law.  You may contact the Health Information Management Office of [facility] to get a copy of your results.     If you are over 20 year old, we cannot discuss your personal health information with a parent, spouse, family member, or anyone else without your consent.  This does not include those who have legitimate access to your records and information to assist in your care under the provisions of HIPAA [facility](Health Insurance Portability and Accountability Act) law, or those to whom you have previously given written consent to do so, such a legal guardian or Power of [place].      Instructions are discussed with patient upon discharge by clinical staff with all questions answered.  Please call [facility] [phone]) if any further questions develop.  Go immediately to the emergency department if any concerns or worsening symptoms.      Theresia MajorsBeth Murielle Stang, DNP, FNP 12/17/2014, 10:43

## 2015-02-10 ENCOUNTER — Ambulatory Visit (INDEPENDENT_AMBULATORY_CARE_PROVIDER_SITE_OTHER): Payer: No Typology Code available for payment source

## 2015-02-10 ENCOUNTER — Encounter (INDEPENDENT_AMBULATORY_CARE_PROVIDER_SITE_OTHER): Payer: Self-pay

## 2015-02-10 VITALS — BP 110/71 | HR 72 | Temp 98.1°F | Resp 14 | Ht 72.0 in | Wt 189.0 lb

## 2015-02-10 DIAGNOSIS — Z23 Encounter for immunization: Secondary | ICD-10-CM

## 2015-02-10 NOTE — Progress Notes (Signed)
Patient Name: Kirk Gonzalez  MRN# [id number]  DOB: 05-08-1995       Kirk Gonzalez is a 20 y.o. who [facility] today for administration/placement of Varicella for school enrollment requirements. No past vaccine reactions recalled.      Screening Checklist for Contraindications to Vaccines for Adults   1. Are you sick today?  No  2. Do you have allergies to medications, food, a vaccine component, or latex?  No  3. Have you ever had a serious reaction after receiving a vaccination?  No  4. Do you have a long-term health problem with heart disease, lung disease, asthma, kidney disease, metabolic disease (e.g., diabetes), anemia, or other blood disorder? No  5. Do you have cancer, leukemia, HIV/AIDS, or any other immune system problem?  No  6. In the past 3 months, have you taken medications that weaken your immune system, such as cortisone, prednisone, other steroids, or anticancer drugs, or have you had radiation treatments? No  7. Have you had a seizure or a brain or other nervous system problem? No  8. During the past year, have you received a transfusion of blood or blood products, or been given immune (gamma) globulin or an antiviral drug? No  9. For women: Are you pregnant or is there a chance you could become pregnant during the next month?  No  10. Have you received any vaccinations in the past 4 weeks? No  Did you bring your immunization record card with you?   It is important for you to have a personal record of your vaccinations. No  Visit Vitals    BP 110/71    Pulse 72    Temp 36.7 C (98.1 F) (Thermal Scan)    Resp 14    Ht 1.829 m (6')    Wt 85.7 kg (189 lb)    SpO2 98%    BMI 25.63 kg/m2     No LMP for male patient.  Immunization administered     Name Date Dose VIS Date Route    VARICELLA VACCINE LIVE(ADMIN) 02/10/2015 0.5 mL 03/24/2006 Subcutaneous    Site: Right arm    Given By: Zonia Kief, RN    Manufacturer: [employer]    Lot: [id number]    NDC: [id number]          He  was instructed to  please remain 20 minutes after the injection and to report a reaction to the nurse as soon as he suspects it.  He was also given written instructions.  Zonia Kief, RN 02/10/2015, 13:08

## 2015-02-10 NOTE — Patient Instructions (Signed)
Chickenpox Vaccine: What You Need to Know  1. Why get vaccinated?  Chickenpox (also called varicella) is a common childhood disease. It is usually mild, but it can be serious, especially in young infants and adults.   It causes a rash, itching, fever, and tiredness.   It can lead to severe skin infection, scars, pneumonia, brain damage, or death.   The chickenpox virus can be spread from person to person through the air, or by contact with fluid from chickenpox blisters.   A person who has had chickenpox can get a painful rash called shingles years later.   Before the vaccine, about 11,000 people were hospitalized for chickenpox each year in the [place].   Before the vaccine, about 100 people died each year as a result of chickenpox in the [place].  Chickenpox vaccine can prevent chickenpox.  Most people who get chickenpox vaccine will not get chickenpox. But if someone who has been vaccinated does get chickenpox, it is usually very mild. They will have fewer blisters, are less likely to have a fever, and will recover faster.  2. Who should get chickenpox vaccine and when?  Routine  Children who have never had chickenpox should get 2 doses of chickenpox vaccine at these ages:   1st Dose: 74-66 months of age   90nd Dose: 21-63 years of age (may be given earlier, if at least 3 months after the 1st dose)  People 14 years of age and older (who have never had chickenpox or received chickenpox vaccine) should get two doses at least 28 days apart.  Catch-up  Anyone who is not fully vaccinated, and never had chickenpox, should receive one or two doses of chickenpox vaccine. The timing of these doses depends on the person's age. Ask your doctor.  Chickenpox vaccine may be given at the same time as other vaccines.  Note: A "combination" vaccine called MMRV, which contains both chickenpox and MMR vaccines, may be given instead of the two individual vaccines to people 21 years of age and younger.  3. Some  people should not get chickenpox vaccine or should wait.   People should not get chickenpox vaccine if they have ever had a life-threatening allergic reaction to a previous dose of chickenpox vaccine or to gelatin or the antibiotic neomycin.   People who are moderately or severely ill at the time the shot is scheduled should usually wait until they recover before getting chickenpox vaccine.   Pregnant women should wait to get chickenpox vaccine until after they have given birth. Women should not get pregnant for 1 month after getting chickenpox vaccine.   Some people should check with their doctor about whether they should get chickenpox vaccine, including anyone who:    Has HIV/AIDS or another disease that affects the immune system    Is being treated with drugs that affect the immune system, such as steroids, for 2 weeks or longer    Has any kind of cancer    Is getting cancer treatment with radiation or drugs   People who recently had a transfusion or were given other blood products should ask their doctor when they may get the chickenpox vaccine.  Ask your doctor for more information.  4. What are the risks from chickenpox vaccine?  A vaccine, like any medicine, is capable of causing serious problems, such as severe allergic reactions. The risk of chickenpox vaccine causing serious harm, or death, is extremely small.  Getting chickenpox vaccine is much safer than getting  chickenpox disease. Most people who get chickenpox vaccine do not have any problems with it. Reactions are usually more likely after the 21 dose than after the second.   Mild problems   Soreness or swelling where the shot was given (about 1 out of 5 children and up to 1 out of 3 adolescents and adults)   Fever (1 person out of 10, or less)   Mild rash, up to a month after vaccination (1 person out of 25). It is possible for these people to infect other members of their household, but this is extremely rare.  Moderate  problems   Seizure (jerking or staring) caused by fever (very rare).  Severe problems   Pneumonia (very rare)  Other serious problems, including severe brain reactions and low blood count, have been reported after chickenpox vaccination. These happen so rarely experts cannot tell whether they are caused by the vaccine or not. If they are, it is extremely rare.  Note: The first dose of MMRV vaccine has been associated with rash and higher rates of fever than MMR and varicella vaccines given separately. Rash has been reported in about 1 person in 20 and fever in about 1 person in 5.  Seizures caused by a fever are also reported more often after MMRV. These usually occur 5-12 days after the first dose.  5. What if there is a serious reaction?  What should I look for?   Look for anything that concerns you, such as signs of a severe allergic reaction, very high fever, or behavior changes.   Signs of a severe allergic reaction can include hives, swelling of the face and throat, difficulty breathing, a fast heartbeat, dizziness, and weakness. These would start a few minutes to a few hours after the vaccination.  What should I do?   If you think it is a severe allergic reaction or other emergency that can't wait, call 9-1-1 or get the person to the nearest hospital. Otherwise, call your doctor.   Afterward, the reaction should be reported to the Vaccine Adverse Event Reporting System (VAERS). Your doctor might file this report, or you can do it yourself through the VAERS web site at www.vaers.SamedayNews.es or by calling 239-756-0092.  VAERS is only for reporting reactions. They do not give medical advice.  6. The National Vaccine Injury Compensation Program  The [employer] Vaccine Injury Compensation Program (VICP) is a federal program that was created to compensate people who may have been injured by certain vaccines.  Persons who believe they may have been injured by a vaccine can learn about the program and about filing  a claim by calling (980)619-5989 or visiting the [place] website at GoldCloset.com.ee.  7. How can I learn more?   Ask your doctor.   Call your local or state health department.   Contact the Centers for Disease Control and Prevention (CDC):    Call 952-491-1966 (1-800-CDC-INFO) or    Visit the CDC's website at http://hunter.com/  CDC Chickenpox Vaccine VIS (03/24/06)     This information is not intended to replace advice given to you by your health care provider. Make sure you discuss any questions you have with your health care provider.     Document Released: 10/22/2005 Document Revised: 01/18/2014 Document Reviewed: 02/07/2013  Elsevier Interactive Patient Education 2016 [employer]    PATIENT NAME:  Kirk Gonzalez  MRN:  [id number]  DOB:  1995/02/28  DATE OF SERVICE: 02/10/2015  Patient Discharge Instructions    IMMUNIZATION  You should remain 20 minutes in the clinic after the injection is administered.  If you experience a rash, hives or shortness of breath, return to the Nurse's Station immediately.    The injection site may be reddened, warm to touch and/or slightly painful.  Cool compress may help.  Tylenol may be taken according to package directions.    Fever usually low grade fever for next 24 hours.    If you should have more severe reaction such as high fever, difficulty breathing or any serious allergic reaction, contact your provider or call 911 immediately.    PPD  ** If you have had a PPD please schedule a follow up appointment in 48-72 hours for the PPD reading.

## 2015-07-25 ENCOUNTER — Ambulatory Visit (INDEPENDENT_AMBULATORY_CARE_PROVIDER_SITE_OTHER): Payer: No Typology Code available for payment source

## 2015-07-25 ENCOUNTER — Encounter (INDEPENDENT_AMBULATORY_CARE_PROVIDER_SITE_OTHER): Payer: Self-pay

## 2015-07-25 ENCOUNTER — Encounter (FREE_STANDING_LABORATORY_FACILITY)
Admit: 2015-07-25 | Discharge: 2015-07-25 | Disposition: A | Discharge status: Home / Self Care | Payer: No Typology Code available for payment source | Attending: Emergency Medicine | Admitting: Emergency Medicine

## 2015-07-25 VITALS — BP 136/71 | HR 63 | Temp 96.9°F | Resp 17 | Ht 72.0 in | Wt 187.4 lb

## 2015-07-25 DIAGNOSIS — Z7251 High risk heterosexual behavior: Secondary | ICD-10-CM | POA: Insufficient documentation

## 2015-07-25 LAB — HEPATITIS C ANTIBODY SCREEN WITH REFLEX TO HCV PCR: HCV ANTIBODY QUALITATIVE: NEGATIVE

## 2015-07-25 NOTE — Progress Notes (Addendum)
History of Present Illness: Kirk Gonzalez is a 20 y.o. male who presents to the clinic today with chief complaint of    Chief Complaint     STI Screening           .   Patient with PHx of asthma presents to [facility] for STI screening. Denies any known exposures. Denies dysuria, gross hematuria, penile discharge, testicular pain, genital lesions, flank pain, fevers, or chills. Patient's main concern is that he had oral sex performed on him after he had a small razor cut to the area from shaving.    Known exposures to STIs: denies  Past Hx of STIs: denies  Last sexual contact: yesterday   Condom use: denies (has not had vaginal intercourse)  Sexual partners in last 60 days: 3  Lifetime sexual partners: 25  Gender(s) of current/past partners: female  Contraception method: unknown  Forms of sex (eg, vaginal, oral, anal): oral only  Hx of IVDU in self or past/present partners: denies  Has paid or has been paid for sex in the past: denies    I reviewed and confirmed the patient's past medical history taken by the nurse or medical assistant with the addition of the following:    Past Medical History:    Past Medical History:   Diagnosis Date    Asthma          Past Surgical History:    Past Surgical History:   Procedure Laterality Date    HX APPENDECTOMY           Allergies:  Allergies   Allergen Reactions    Nkda [No Known Drug Allergies]      Medications:    No current outpatient prescriptions on file.     Social History:    Social History   Substance Use Topics    Smoking status: Never Smoker    Smokeless tobacco: Never Used    Alcohol use 0.0 oz/week     0 Standard drinks or equivalent per week     Family History: No significant family history.  Family History   Problem Relation Age of Onset    Healthy Mother     Healthy Father          Physical Activity:  On average, how many days/week do you engage in moderate to vigorous physical activity (like brisk walking)?: 5 Days  On average, how many minutes do you enage in  physical activity at this level?: 45-60 Minutes/day  Total activity days/week x minutes/day =: 300    Other:  Functional Health Screening:     Patient is under 18:  No   Have you had a recent unexplained weight loss or gain?:  No   Because we are aware of abuse and domestic violence today, we ask all patients: Are you being hurt, hit, or frightened by anyone at your home or in your life?:  No   Do you have any basic needs within your home that are not being met? (such as Food, Shelter, Games developer, Transportation):  No   Patient is under 18 and therefore has no Advance Directives:  No        PHQ Questionnaire     Fall Risk Assessment                Review of Systems:  All other ROS otherwise negative except as per HPI.      Physical Exam:  Vital signs:   Vitals:    07/25/15 6283  BP: 136/71   Pulse: 63   Resp: 17   Temp: 36.1 C (96.9 F)   TempSrc: Thermal Scan   SpO2: 96%   Weight: 85 kg (187 lb 6.3 oz)   Height: 1.829 m (6')     Body mass index is 25.41 kg/(m^2). Facility age limit for growth percentiles is 20 years.  No LMP for male patient.    General:  Well appearing and No acute distress  Head:  Normocephalic and Atraumatic  Neck:  supple  Pulmonary:  clear to auscultation bilaterally, no wheezes, no rales, no rhonchi, no retraction and no stridor  Cardiovascular:  regular rate/rhythm, normal S1/S2 and no murmur/rub/gallop  Genito-urinary:  normal penis, no discharge, normal testes without mass and no genital lesions, ulcers, sores, blisters, or cuts; no CVAT and no suprapubic tenderness  Skin:  warm/dry and no rash  Psychiatric:  Appropriate affect and behavior and Normal speech  Neurologic:   Alert and oriented x 3 and no focal neurologic deficits    Data Reviewed:    Not applicable    Course: Condition at discharge: Good     Differential Diagnosis: STI screen    Assessment:   1. High-risk sexual behavior        Plan:    Extensive conversation with patient about STIs and risks. Will defer HSV serology, as  recent sexual activity was yesterday and unlikely that the virus would show up that quickly. (Further, low concern for HSV exposure anyway.) No lesions on genitals. Reassurance given. Encouraged condom use, though patient only engages in oral sex at this point. Routine screening otherwise for GC/chlamydia, HIV, Hep C, and RPR. RTC prn.  Patient would prefer to receive his test results by phone, rather than MyChart.  Orders Placed This Encounter    HIV1/HIV2 SCREEN, COMBINED ANTIGEN AND ANTIBODY    HEPATITIS C ANTIBODY SCREEN WITH REFLEX TO HCV PCR    NEISSERIA GONORRHOEAE DNA BY PCR    CHLAMYDIA TRACHOMITIS DNA BY PCR (INHOUSE)    SYPHILIS, RAPID PLASMIN REAGIN (RPR), WITH TITER REFLEX          Go to Emergency Department immediately for further work up if any concerning symptoms.  Plan was discussed and patient verbalized understanding.  If symptoms are worsening or not improving the patient should return to the Urgent Care for further evaluation.    Ladonna Snide, MD 07/25/2015, 08:47

## 2015-07-28 LAB — SYPHILIS, RAPID PLASMIN REAGIN (RPR), WITH TITER REFLEX: RPR QUALITATIVE: NONREACTIVE

## 2015-07-28 LAB — CHLAMYDIA TRACHOMITIS DNA BY PCR (INHOUSE): CHLAMYDIA TRACHOMATIS PCR: NOT DETECTED

## 2015-08-27 ENCOUNTER — Ambulatory Visit (INDEPENDENT_AMBULATORY_CARE_PROVIDER_SITE_OTHER): Payer: No Typology Code available for payment source

## 2015-08-27 ENCOUNTER — Encounter (INDEPENDENT_AMBULATORY_CARE_PROVIDER_SITE_OTHER): Payer: Self-pay

## 2015-08-27 ENCOUNTER — Encounter (FREE_STANDING_LABORATORY_FACILITY)
Admit: 2015-08-27 | Discharge: 2015-08-27 | Disposition: A | Discharge status: Home / Self Care | Payer: No Typology Code available for payment source | Attending: Family Medicine | Admitting: Family Medicine

## 2015-08-27 VITALS — BP 116/63 | HR 75 | Temp 97.0°F | Resp 18 | Ht 72.0 in | Wt 179.0 lb

## 2015-08-27 DIAGNOSIS — K2970 Gastritis, unspecified, without bleeding: Secondary | ICD-10-CM

## 2015-08-27 DIAGNOSIS — R110 Nausea: Principal | ICD-10-CM | POA: Insufficient documentation

## 2015-08-27 LAB — CBC WITH DIFF
BASOPHIL #: 0.06 x10ˆ3/uL (ref 0.00–0.20)
BASOPHIL %: 1 %
EOSINOPHIL #: 0.08 x10ˆ3/uL (ref 0.00–0.50)
EOSINOPHIL %: 2 %
HCT: 44.3 % (ref 36.7–47.0)
HGB: 15.2 g/dL (ref 12.5–16.3)
LYMPHOCYTE #: 1.1 x10ˆ3/uL (ref 1.00–4.80)
LYMPHOCYTE %: 20 %
MCH: 29.8 pg (ref 27.4–33.0)
MCHC: 34.4 g/dL (ref 32.5–35.8)
MCV: 86.5 fL (ref 78.0–100.0)
MONOCYTE #: 0.33 x10ˆ3/uL (ref 0.30–1.00)
MONOCYTE %: 6 %
MPV: 8.8 fL (ref 7.5–11.5)
NEUTROPHIL #: 3.96 x10ˆ3/uL (ref 1.50–7.70)
NEUTROPHIL %: 72 %
PLATELETS: 213 x10ˆ3/uL (ref 140–450)
RBC: 5.12 x10ˆ6/uL (ref 4.06–5.63)
RDW: 12.3 % (ref 12.0–15.0)
WBC: 5.5 x10ˆ3/uL (ref 3.5–11.0)

## 2015-08-27 LAB — COMPREHENSIVE METABOLIC PANEL, NON-FASTING
ALBUMIN: 4.3 g/dL (ref 3.5–5.0)
ALKALINE PHOSPHATASE: 96 U/L (ref <150)
ALT (SGPT): 33 U/L (ref <55)
ANION GAP: 8 mmol/L (ref 4–13)
AST (SGOT): 32 U/L (ref 8–48)
BILIRUBIN TOTAL: 0.8 mg/dL (ref 0.3–1.3)
BUN/CREA RATIO: 14 (ref 6–22)
BUN: 13 mg/dL (ref 8–25)
CALCIUM: 9.7 mg/dL (ref 8.5–10.2)
CHLORIDE: 103 mmol/L (ref 96–111)
CO2 TOTAL: 28 mmol/L (ref 22–32)
CREATININE: 0.96 mg/dL (ref 0.62–1.27)
ESTIMATED GFR: >59 mL/min/1.73mˆ2 (ref >59)
GLUCOSE: 91 mg/dL (ref 65–139)
POTASSIUM: 3.9 mmol/L (ref 3.5–5.1)
PROTEIN TOTAL: 8.3 g/dL (ref 6.4–8.3)
SODIUM: 139 mmol/L (ref 136–145)

## 2015-08-27 LAB — LIPASE: LIPASE: 13 U/L (ref 10–80)

## 2015-08-27 LAB — AMYLASE: AMYLASE: 75 U/L (ref 25–125)

## 2015-08-27 MED ORDER — ONDANSETRON HCL 2 MG/ML INTRAVENOUS SOLUTION
4.0000 mg | Freq: Once | INTRAVENOUS | 0 refills | Status: AC
Start: 2015-08-27 — End: 2015-08-27

## 2015-08-27 MED ORDER — SODIUM CHLORIDE 0.9 % INTRAVENOUS SOLUTION
1000.0000 mL | Freq: Once | INTRAVENOUS | 0 refills | Status: AC
Start: 2015-08-27 — End: 2015-08-27

## 2015-08-27 MED ORDER — ONDANSETRON 4 MG DISINTEGRATING TABLET
4.00 mg | ORAL_TABLET | Freq: Three times a day (TID) | ORAL | 0 refills | Status: DC | PRN
Start: 2015-08-27 — End: 2018-01-14

## 2015-08-27 NOTE — Nurses Notes (Signed)
[facility] tolerated well, labs also drawn ( 1 light green 1 lavender) nurse made aware and labs sent Tye Savoyllen Rai Sinagra, [facility]  08/27/2015, 10:41

## 2015-08-27 NOTE — Progress Notes (Signed)
History of Present Illness: Kirk Gonzalez is a 20 y.o. male who presents [facility] today with chief complaint of    Chief Complaint     Nausea           .     Location: GI  Quality: nausea with forced vomiting x 1 without improvement  Onset: around 2:30am last night (stayed awake until 430am)  Severity: awoke from sleep last night  Timing: constant  Context: drank ETOH yesterday/last night (unknown amount) for [employer] and smoked marijuana but denies other drug use; has had appendix removed  Modifying factors: none tried  Aggravating/allevating factors: worse with lying flat  Associated symptoms: denies abdominal pain and diarrhea; last normal BM was this morning; no blood noted; decreased appetite    Patient has had recent negative HIV testing.    On average, how many days/week do you engage in moderate to vigorous physical activity (like brisk walking)?: 5 Days  On average, how many minutes do you enage in physical activity at this level?: 45-60 Minutes/day  Total activity days/week x minutes/day =: 300      Functional Health Screening:     Patient is under 18:  No   Have you had a recent unexplained weight loss or gain?:  No   Because we are aware of abuse and domestic violence today, we ask all patients: Are you being hurt, hit, or frightened by anyone at your home or in your life?:  No   Do you have any basic needs within your home that are not being met? (such as Food, Shelter, Games developer, Transportation):  No   Patient is under 18 and therefore has no Advance Directives:  No        PHQ Questionnaire     Fall Risk Assessment               I reviewed and confirmed the patient's past medical history taken by the nurse or medical assistant with the addition of the following:    Past Medical History:    Past Medical History:   Diagnosis Date    Asthma          Past Surgical History:    Past Surgical History:   Procedure Laterality Date    HX APPENDECTOMY           Allergies:  Allergies    Allergen Reactions    Nkda [No Known Drug Allergies]      Medications:    Current Outpatient Prescriptions   Medication Sig    0.9 % SODIUM CHLORIDE (NS) 0.9 % Intravenous Parenteral Solution 1,000 mL by Intravenous route One time for 1 dose    ondansetron (ZOFRAN ODT) 4 mg Oral Tablet, Rapid Dissolve 1 Tab (4 mg total) by Sublingual route Every 8 hours as needed for nausea/vomiting    ondansetron (ZOFRAN, AS HYDROCHLORIDE,) 2 mg/mL Intravenous Solution 2 mL (4 mg total) by Intravenous route One time for 1 dose     Social History:    Social History   Substance Use Topics    Smoking status: Never Smoker    Smokeless tobacco: Never Used    Alcohol use 0.0 oz/week     0 Standard drinks or equivalent per week     Family History: No significant family history.  Family History   Problem Relation Age of Onset    Healthy Mother     Healthy Father          Review of  Systems:    General: no fever  ENT:  sore throat and no runny nose  Pulmonary:   no dry cough and no productive cough  Gastrointestinal:  nausea, vomiting, no diarrhea and no abdominal pain  Neurologic:  no headache and dizziness that has resolved  Skin:  no rash  All other review of systems were negative    Physical Exam:  Vital signs:   Vitals:    08/27/15 1006 08/27/15 1114   BP: (!) 130/93 116/63   Pulse: 72 75   Resp: 18    Temp: 36.1 C (97 F)    TempSrc: Tympanic    SpO2: 99% 98%   Weight: 81.2 kg (179 lb)    Height: 1.829 m (6')        No LMP for male patient.      General:  Well appearing and No acute distress  Head:  Normocephalic  Eyes:  Normal lids/lashes and normal conjunctiva  ENT:  normal EAC's, normal TM's, MMM, normal pharynx/tonsils and normal tongue/uvula  Neck:  supple  Pulmonary:  clear to auscultation bilaterally, no wheezes, no rales and no rhonchi  Cardiovascular:  regular rate/rhythm and normal S1/S2  Gastrointestinal:  Non-distended, normal bowel sounds, soft, no hepatosplenomegaly, no rebound, no guarding and mild diffuse  tenderness  Skin:  warm/dry and no rash  Psychiatric:  Appropriate affect and behavior  Neurologic:   Alert and oriented x 3  Hem/Lymph:  No lymphadenopathy    Data Reviewed:      Point-of-care testing:  Time collected: 1119  Leukocytes: Negative  Nitrite: Negative  Urobilinogen: Normal   Protein: Negative  pH: 7.0  Blood (urine): Trace Intact  Urine Specific Gravity: 1.015  Ketones: Negative  Bilirubin: Negative  Glucose: Negative                        X-ray:  Deferred at this time.    Course: Condition at discharge: Good     11:05 am - Patient tolerating PO intake.  Nausea has resolved.  Still has some lingering abdominal pressure or decreased appetite.  Appears comfortable.      Differential Diagnosis: alcohol induced gastritis vs gastroenteritis vs pancreatitis vs cholecystitis    Assessment:   1. Gastritis, presence of bleeding unspecified, unspecified chronicity, unspecified gastritis type    2. Nausea        Plan:    Orders Placed This Encounter    CBC/DIFF    COMPREHENSIVE METABOLIC PANEL, NON-FASTING    AMYLASE    LIPASE    POCT AUTOMATED RACK URINE WITHOUT MICROSCOPY    0.9 % SODIUM CHLORIDE (NS) 0.9 % Intravenous Parenteral Solution    ondansetron (ZOFRAN, AS HYDROCHLORIDE,) 2 mg/mL Intravenous Solution    ondansetron (ZOFRAN ODT) 4 mg Oral Tablet, Rapid Dissolve     Medications N-Z: Zofran 59m/2ml  Route of Administration: IV (push)  Peripheral IV site used: Right  NDC #: [address]-58 LOT #: [id number] Expiration date: 04/11/16  Manufacturer: [employer]  Clinic Supplied: Yes  Comments:: pt tolerated    Symptomatic care and side effects of medication were discussed with patient.  Patient has no additional concerns or needs at this time.    Patient to rtc if any concerning signs or symptoms or if symptoms remain persistent.      Continue current activity level.  Increase hydration.  May use Zofran as needed.  RTC if no significant improvement.       Go to  Emergency Department immediately for further  work up if any concerning symptoms.  Plan was discussed and patient verbalized understanding.  If symptoms are worsening or not improving the patient should return to the Urgent Care/[facility] for further evaluation.    Kerrin Mo, DO 08/27/2015, 11:27

## 2015-08-27 NOTE — Nursing Note (Signed)
08/27/15 1000   Nursing Procedures / Med Admin   Medications N-Z Zofran 4mg /22ml   Medication Dose 4mg /762ml   Route of Administration IV  (push)   Time of Administration 1040   Peripheral IV site used Right   NDC # [id number]   LOT # [id number]   Expiration date 04/11/16   Manufacturer [employer]   Clinic Supplied Yes   Comments: pt tolerated   Initials kd

## 2015-08-27 NOTE — Patient Instructions (Addendum)
[facility]      Operated by [facility]  [address] [place]  [place], [place] [id number]  Phone: [phone]  Fax: [phone]  [email or web address]  Open Daily 8:00a - 8:00p    Closed Thanksgiving and Christmas Day  [facility]     Operated by [facility]  [address]  [facility]  [place], [place] [address]  Phone: [phone]  Fax: [phone]  [email or web address]  Open M-F  8:00a - 8:00p  Sat              10:00a - 4:00p  Sun             Closed  Closed all [place] Holidays        Attending Caregiver: Lilian Kapurarmen Teana Lindahl, DO      Today's orders:   Orders Placed This Encounter    CBC/DIFF    COMPREHENSIVE METABOLIC PANEL, NON-FASTING    AMYLASE    LIPASE    POCT AUTOMATED RACK URINE WITHOUT MICROSCOPY    0.9 % SODIUM CHLORIDE (NS) 0.9 % Intravenous Parenteral Solution    ondansetron (ZOFRAN, AS HYDROCHLORIDE,) 2 mg/mL Intravenous Solution    ondansetron (ZOFRAN ODT) 4 mg Oral Tablet, Rapid Dissolve        Prescription(s) E-Rx to:  [facility] - [place], [place] - [address]    ________________________________________________________________________  Short Term Disability and Family Medical Leave Act   Urgent Care does NOT provide assistance with any disability applications.  If you feel your medical condition requires you to be on disability, you will need to follow up with  your primary care physician or a specialist.  We apologize for any inconvenience.    For Medication Prescribed by [facility]:  As an Urgent Care facility, our clinic does NOT offer prescription refills over the telephone.    If you need more of the medication one of our medical providers prescribed, you will  either need to be re-evaluated by us or see your primary care physician.    ________________________________________________________________________      It is very important that we have a phone number.   This is the single best way to contact you in the event that we become aware of important clinical information or concerns after your discharge.  If the phone number you provided at registration is NOT this number you should inform staff and registration prior to leaving.      Your treatment and evaluation today was focused on identifying and treating potentially emergent conditions based on your presenting signs, symptoms, and history.  The resulting initial clinical impression and treatment plan is not intended to be definitive or a substitute for a full physical examination and evaluation by your primary care provider.  If your symptoms persist, worsen, or you develop any new or concerning symptoms, you need to be evaluated.      If you received x-rays during your visit, be aware that the final and formal interpretation of those films by a radiologist may occur after your discharge.  If there is a significant discrepancy identified after your discharge, we will contact you at the telephone number provided at registration.      If you received a pelvic exam, you may have cultures pending for sexually transmitted diseases.  Positive cultures are reported to the [facility] Department of Health as required by state law.  You may contact the Health Information Management Office of [facility]  to get a copy of your results.     If you are over 21 year old, we cannot discuss your personal health information with a parent, spouse, family member, or anyone else without your consent.  This does not include those who have legitimate access to your records and information to assist in your care under the provisions of HIPAA [facility](Health Insurance Portability and Accountability Act) law, or those to whom you have previously given written consent to do so, such a legal guardian or Power of [place].      Instructions are discussed with patient upon discharge by clinical staff with all questions answered.  Please call [facility] [phone]) if any further questions develop.  Go immediately to the emergency department if any concerns or worsening symptoms.      Lilian Kapurarmen Ivionna Verley, DO 08/27/2015, 11:02        Gastritis (Adult)    Gastritis isinflammation andirritation of the stomach lining.It can be present for a short time (acute) or be long lasting (chronic). Gastritis is often caused by infection with bacteria calledH pylori.More than a third of people in the [place] have this bacteria in their bodies. In many cases,H pyloricauses no problems or symptoms. In some people, though, the infection irritates the stomach lining and causes gastritis. Other causes of stomach irritation include drinking alcohol or taking pain-relieving medicines called NSAIDs (such as aspirin or ibuprofen).  Symptoms of gastritis can include:   Abdominal pain or bloating   Loss of appetite   Nausea or vomiting   Vomiting blood or having black stools   Feeling more tired than usual  An inflamed and irritated stomach lining is more likely to develop a sore called an ulcer. To help prevent this, gastritis should be treated.  Home care  If needed, medicines may be prescribed. If you haveH pyloriinfection, treating it will likely relieve your symptoms. Other changes can help reduce stomach irritation and help it heal.   If you have been prescribed medicines forH pyloriinfection, take them as directed. Take all of the medicine until it is finished or your healthcare provider tells you to stop, even if you feel better.   Your healthcare provider may recommend avoiding NSAIDs. If you take daily aspirin for your heart or other medical reasons, do not stop without talking to your healthcare provider first.   Avoid drinking alcohol.   Stop smoking. Smoking can irritate the stomach and delay healing. As much as possible, stay away from second hand smoke.  Follow-up care  Follow up with your healthcare provider, or as advised by our staff. Testing may be  needed to check for inflammation or an ulcer.  When to seek medical advice  Call your healthcare provider for any of the following:   Stomach pain that gets worse or moves to the lower right abdomen (appendix area)   Chest pain that appears or gets worse, or spreads to the back, neck, shoulder, or arm   Frequent vomiting (cant keep down liquids)   Blood in the stool or vomit (red or black in color)   Feeling weak or dizzy   Fever of 100.56F (38C) or higher, or as directed by your healthcare provider  Date Last Reviewed: 07/02/2013   2000-2017 [employer] [address], [place], [place] [id number]. All rights reserved. This information is not intended as a substitute for professional medical care. Always follow your healthcare professional's instructions.

## 2015-08-27 NOTE — Nursing Note (Signed)
08/27/15 1000   IV Hydration   Peripheral IV site used Right   Gauge of Angiocath 20g   Admin Time 1040   IV FLUID NSS   NDC # [id number]   iv administered via gravity   LOT # [id number]   EXP.Date 10/10/16   Manufacturer [employer]ter   dosage 1000 ml   iv rate wide open   Provider Porfirio Mylar([facility])   Labs Drawn yes 1 light green 1 lavender   IV Start Time 1042   Comments: pt tolerated   Initials kd

## 2015-12-06 ENCOUNTER — Other Ambulatory Visit: Payer: Self-pay

## 2015-12-19 IMAGING — CT CT ABD-PELV W/ CM
2 of 4 series · 4 of 46 positions shown, 6 images · IV contrast (omnipaque)
Comparison: None

CLINICAL DATA: Lower abdominal pain, vomiting, fever

EXAM:
CT ABDOMEN AND PELVIS WITH CONTRAST
TECHNIQUE: Multidetector CT imaging of the abdomen and pelvis was performed
using the standard protocol following bolus administration of
intravenous contrast. Sagittal and coronal MPR images reconstructed
from axial data set.
CONTRAST:  100mL OMNIPAQUE IOHEXOL 300 MG/ML SOLN IV. Dilute oral
contrast.

[Series 206: coronal · coronal · 0.50mm/px · 3 of 86 slices shown, 4 images]
[im 19/86  soft-tissue]
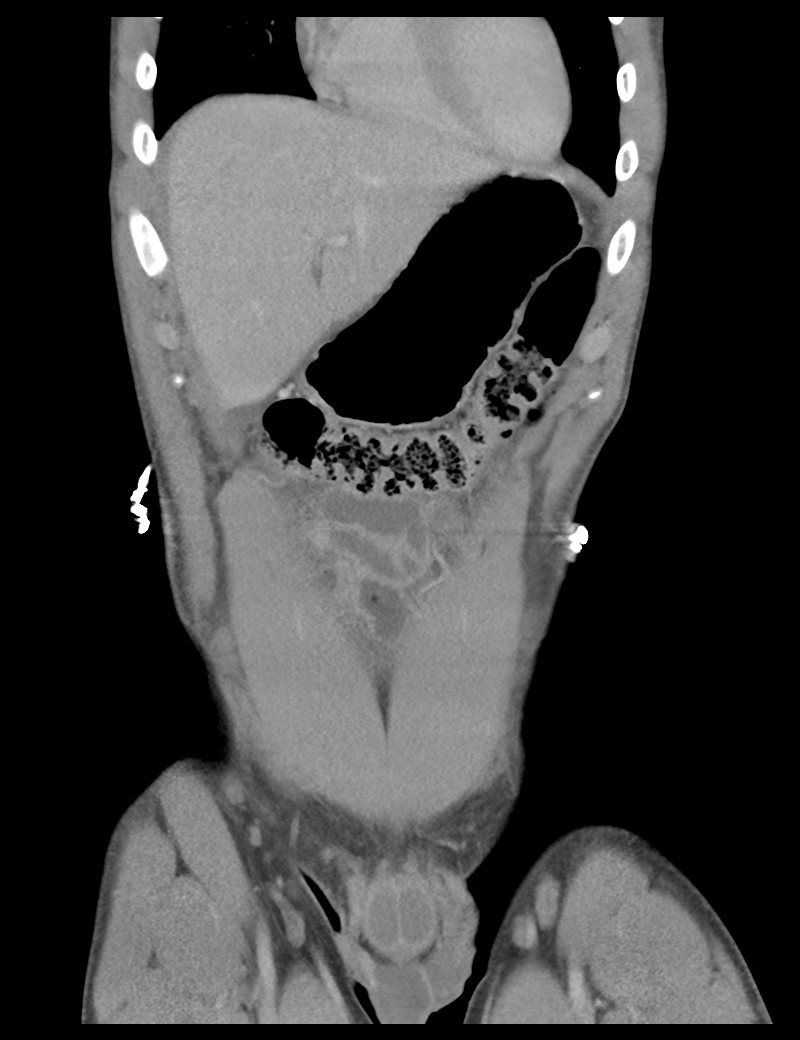
[im 19/86  bone]
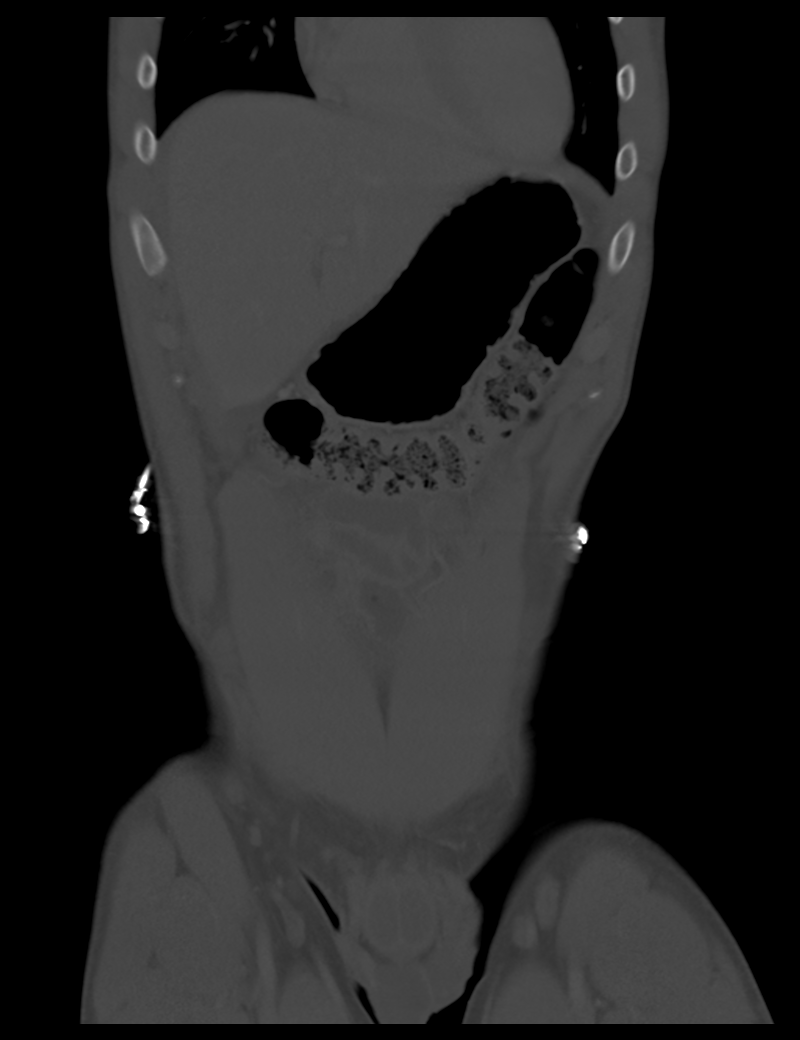
[im 48/86  soft-tissue]
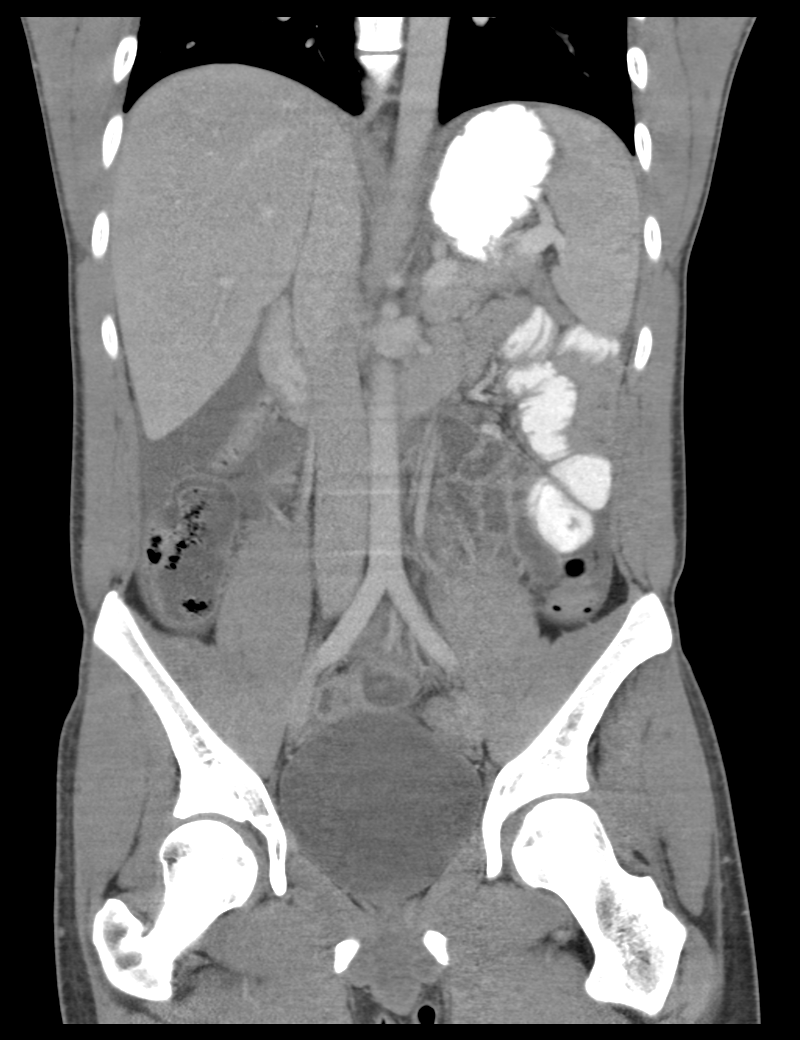
[im 67/86  soft-tissue]
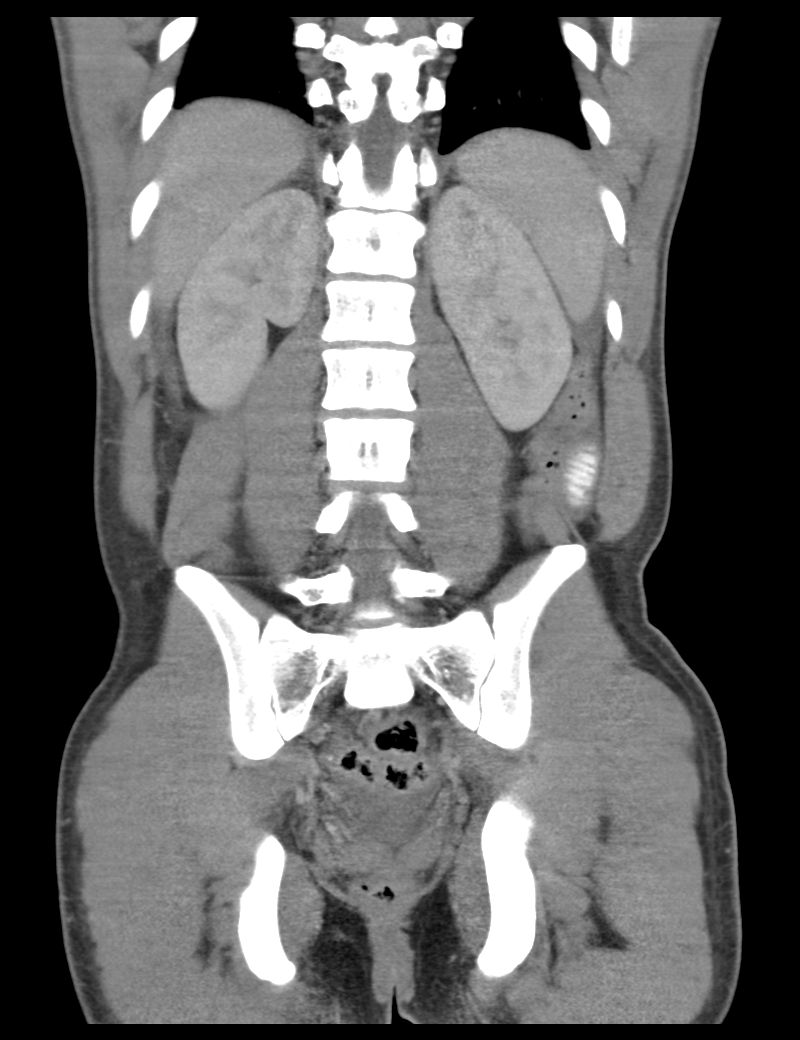

[Series 207: sagittal · sagittal · 0.50mm/px · 1 of 118 slices shown, 2 images]
[im 40/118  soft-tissue]
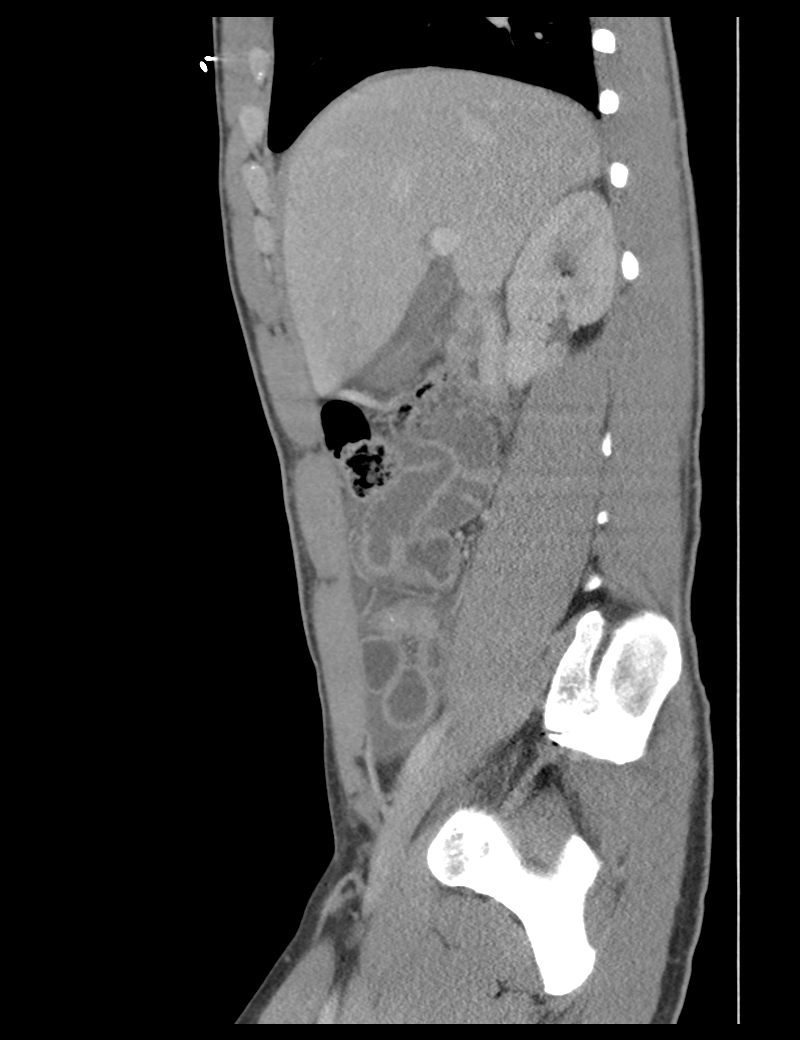
[im 40/118  bone]
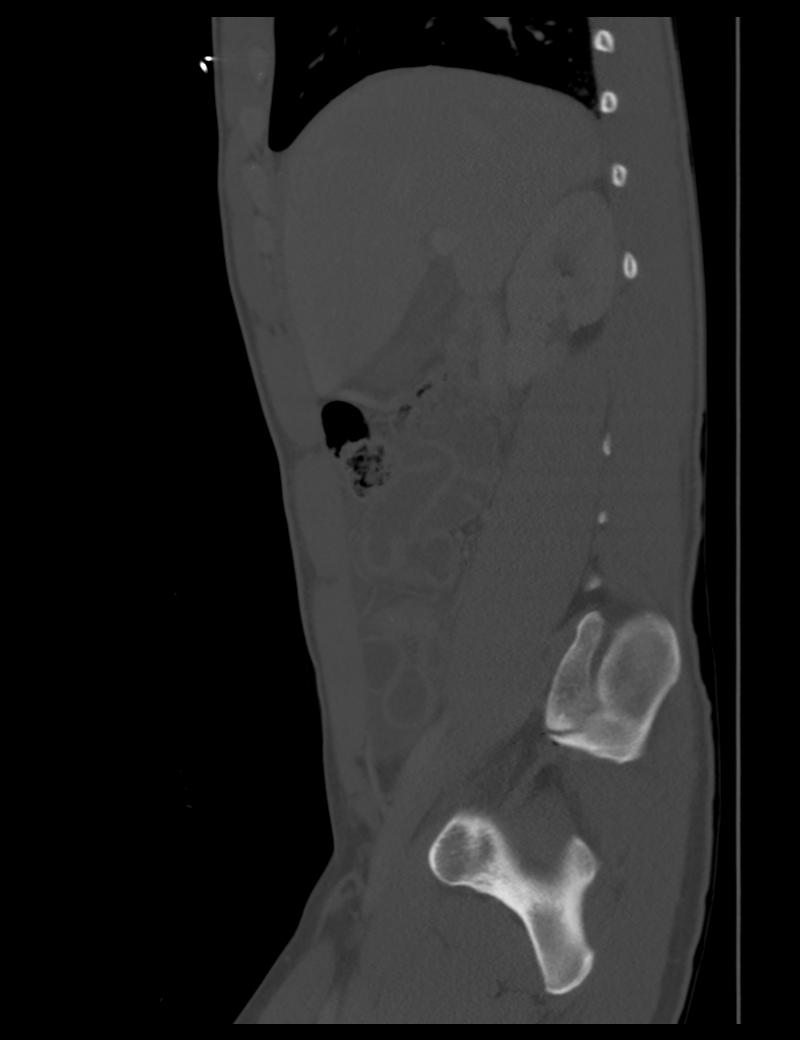

[4 of 46 positions shown; findings below may reference images not displayed]

FINDINGS: Lung bases clear.

Liver, spleen, pancreas, kidneys, and adrenal glands normal
appearance.

Scattered free fluid throughout abdomen and pelvis.

Enlarged appendix with marked wall thickening and periappendiceal
inflammatory changes.

Tiny appendicolith.

Additional calcifications identified at the RIGHT pericolic gutter
image 63, upper pelvis image 61, and more inferiorly in RIGHT pelvis
image 71 identified, external to bowel question intraperitoneal.

No free intraperitoneal air or hernia.

Adjacent cecal wall thickening with scattered mild
thickening/enhancement of small bowel loops raising question of
peritonitis.

Stomach normal appearance.

Bowel loops are suboptimally opacified without evidence of
obstruction.

No mass or adenopathy.

Limbus vertebra L2.
IMPRESSION: Acute appendicitis was adjacent thickening of the cecal wall and of
small bowel loops throughout pelvis.

The presence of free intraperitoneal fluid diffuse small bowel
enhancement, as well as several apparent intraperitoneal
calcifications raise a question of perforated appendicitis and
peritonitis.

Findings called to Dr. Nosa on 06/03/2013 at 2211 hours.

## 2015-12-19 IMAGING — CR DG ABDOMEN ACUTE W/ 1V CHEST
3 series · 3 of 3 positions shown · non-contrast
Comparison: None.

CLINICAL DATA: 17-year-old male with abdominal pain.

EXAM:
ACUTE ABDOMEN SERIES (ABDOMEN 2 VIEW & CHEST 1 VIEW)

[PA]
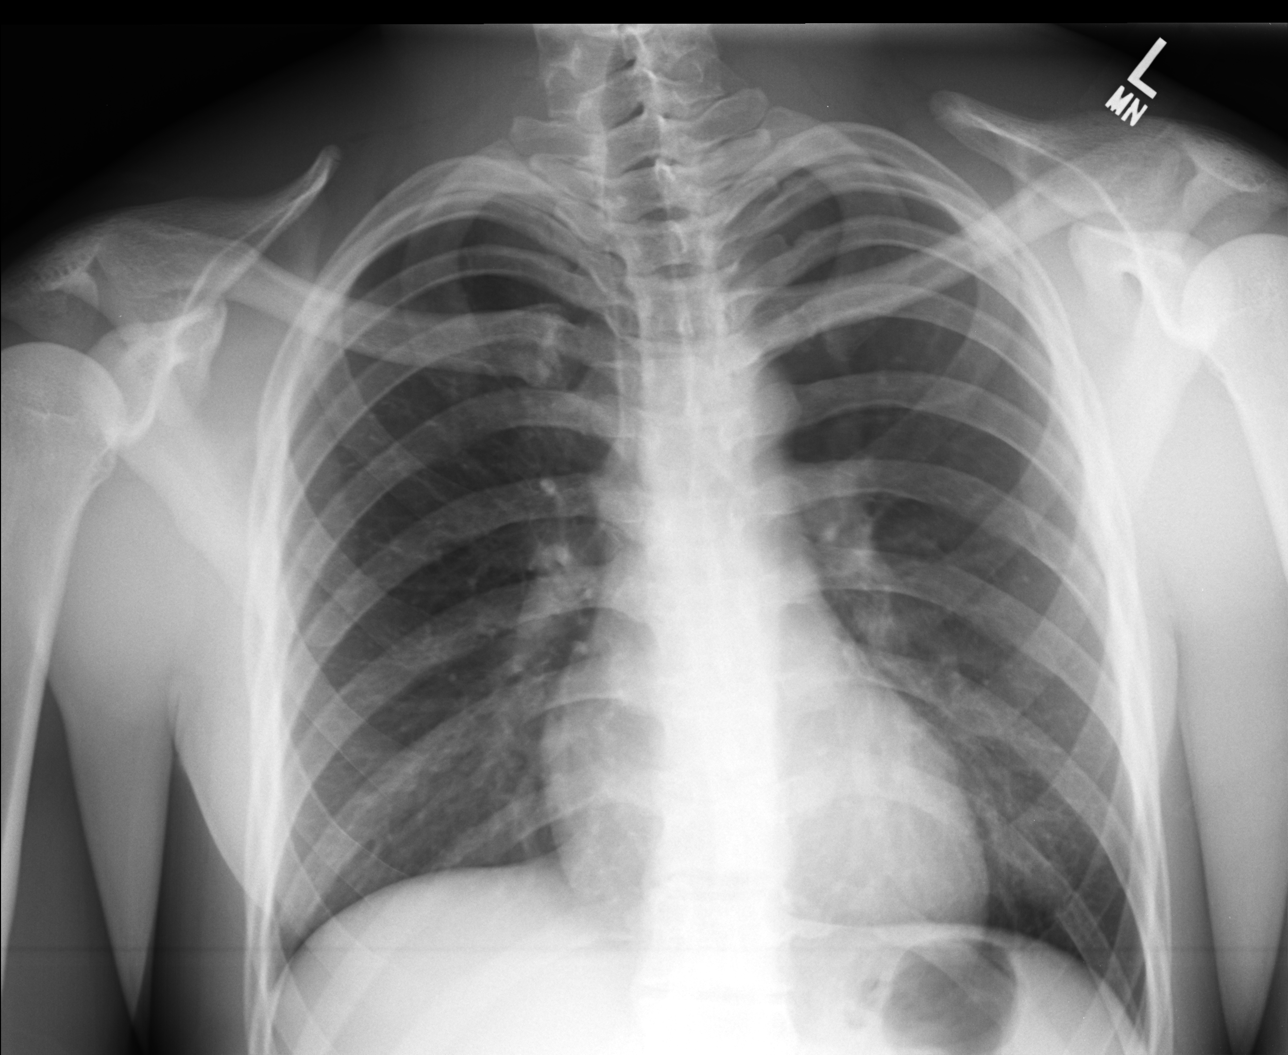

[AP (1 of 2)]
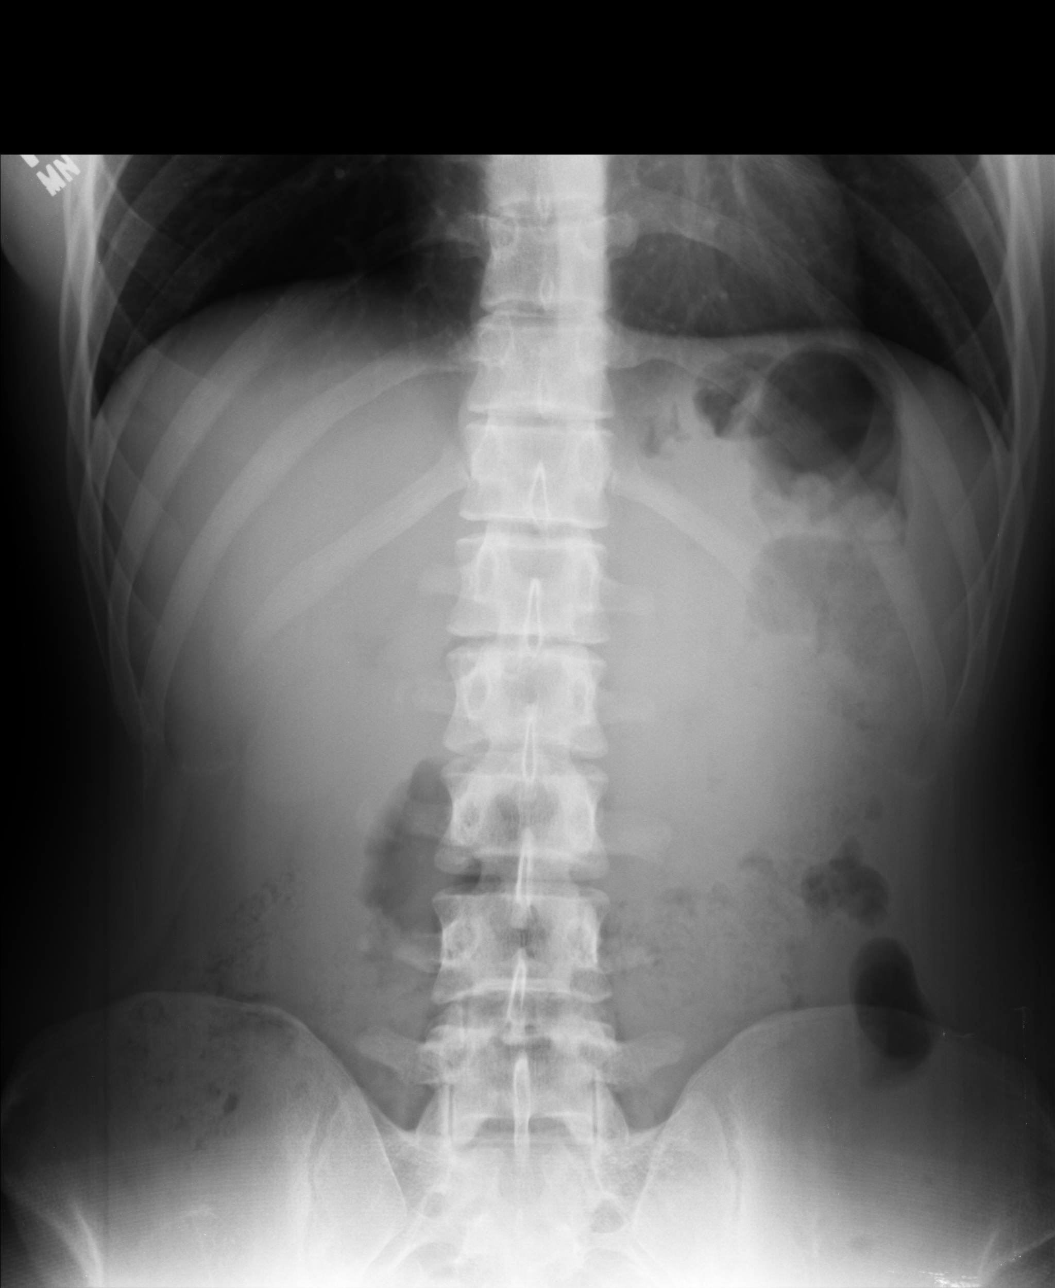

[AP (2 of 2)]
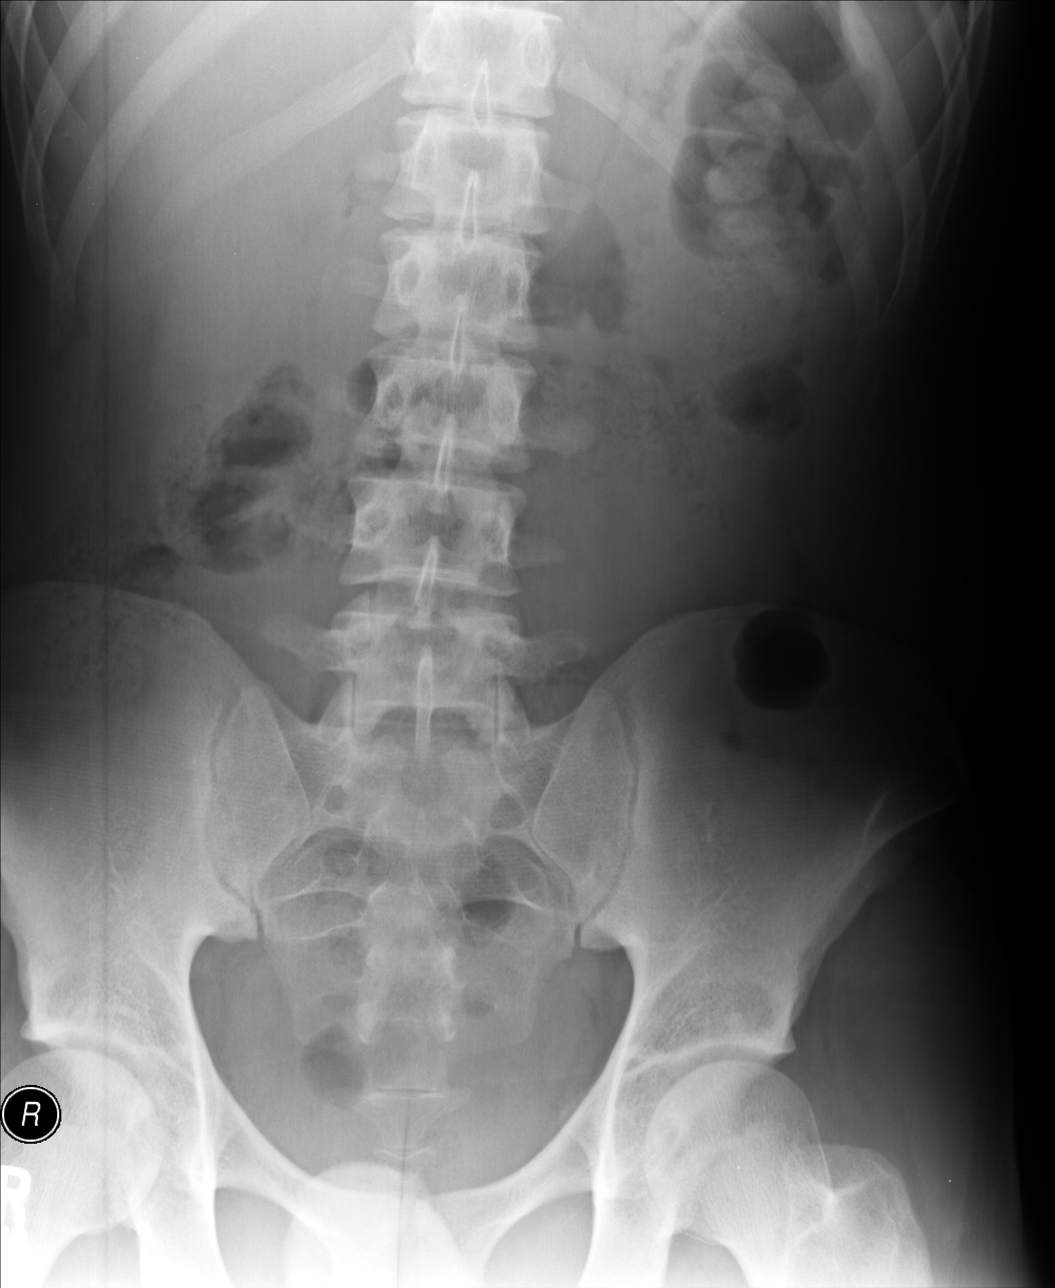

[3 of 3 positions shown; findings below may reference images not displayed]

FINDINGS: The cardiomediastinal silhouette is unremarkable.

The lungs are clear.

There is no evidence of airspace disease, pleural effusion or
pneumothorax.

The bowel gas pattern is normal.

There is no evidence of bowel obstruction or pneumoperitoneum.

No suspicious calcifications are identified.

The bony structures are unremarkable.
IMPRESSION: Negative abdominal radiographs.  No acute cardiopulmonary disease.

## 2016-01-09 ENCOUNTER — Encounter (INDEPENDENT_AMBULATORY_CARE_PROVIDER_SITE_OTHER): Payer: Self-pay

## 2016-01-09 ENCOUNTER — Ambulatory Visit (INDEPENDENT_AMBULATORY_CARE_PROVIDER_SITE_OTHER): Payer: No Typology Code available for payment source

## 2016-01-09 VITALS — BP 129/79 | HR 102 | Temp 97.0°F | Resp 18 | Ht 72.0 in | Wt 186.0 lb

## 2016-01-09 DIAGNOSIS — M25562 Pain in left knee: Principal | ICD-10-CM

## 2016-01-09 DIAGNOSIS — S76302A Unspecified injury of muscle, fascia and tendon of the posterior muscle group at thigh level, left thigh, initial encounter: Secondary | ICD-10-CM

## 2016-01-09 MED ORDER — DICLOFENAC SODIUM 75 MG TABLET,DELAYED RELEASE
75.00 mg | DELAYED_RELEASE_TABLET | Freq: Two times a day (BID) | ORAL | 0 refills | Status: DC | PRN
Start: 2016-01-09 — End: 2018-01-14

## 2016-01-09 NOTE — Progress Notes (Signed)
History of Present Illness: Kirk Gonzalez is a 20 y.o. male who presents to the Urgent Care today with chief complaint of    Chief Complaint     Knee Pain left knee, started a few weeks ago  "burning pain"         Left knee pain and into left hamstring x 3 weeks  Noticed first while running and thought related to stretching   He cut back on lifting with legs but than couldn't stop so kept working out  Location: left knee  Onset: 3 weeks  Context: hurts with activity and 2-3/10 always  Severity: up to 6/10 with activity    Quality: pian no swelling   Aggravating Factors/Timing: bending  Modifying factors:  Treatment Tried: IB 800 without help.  Alleviating Factors: ice and heat no help  Associated symptoms: some locking while working out  Pertinent Negatives:  No fever or injury    I reviewed and confirmed the patient's past medical history taken by the nurse or medical assistant with the addition of the following:    Past Medical History:    Past Medical History:   Diagnosis Date   . Asthma        Past Surgical History:      Past Surgical History:   Procedure Laterality Date   . HX APPENDECTOMY           Allergies:  Allergies   Allergen Reactions   . Nkda [No Known Drug Allergies]      Medications:    No outpatient prescriptions have been marked as taking for the 01/09/16 encounter (Office Visit) with [facility].     Social History:    Social History   Substance Use Topics   . Smoking status: Never Smoker   . Smokeless tobacco: Never Used   . Alcohol use 0.0 oz/week     0 Standard drinks or equivalent per week     Family History: No significant family history.  Family Medical History     Problem Relation (Age of Onset)    Healthy Mother, Father            Review of Systems:    Gastrointestinal:  no nausea and no diarrhea  Skin:  no rash    Physical Exam:  Vital signs:   BP 129/79  Pulse (!) 102  Temp 36.1 C (97 F) (Thermal Scan)  Resp 18  Ht 1.829 m (6')  Wt 84.4 kg (186 lb)  SpO2 97%  BMI  25.23 kg/m2  General: appears in good health  Left knee  Full range of motion no locking popping no effusion negative grind test normal strengths  Data Reviewed:    Not applicable    Course: Condition at discharge: Good     Differential Diagnosis:  Sprain vs.  Tendinitis    Assessment:   1. Left knee pain    2. Left hamstring injury, initial encounter        Plan:    Orders Placed This Encounter   . AMB CONSULT/REFERRAL PHYS THERAPY- EXTERNAL   . [facility]   . diclofenac sodium (VOLTAREN) 75 mg Oral Tablet, Delayed Release (E.C.)    Refer for physical therapy recommend rest and ice until seen.  Also placed Sports Medicine referral since he is very active and was to return to exercise as soon as possible       Patient agrees with above treatment.  Go to Emergency Department immediately  for further work up if any concerning symptoms.  Plan was discussed and patient verbalized understanding.  If symptoms are worsening or not improving the patient should return to the Urgent Care for further evaluation.    Stacie AcresHolly Hartman-Adams, MD 01/09/2016, 15:39

## 2016-01-09 NOTE — Patient Instructions (Addendum)
Marland Kitchen.   [facility]      Operated by [facility]  [address]  [place], [place] [address]  Phone: [phone]  Fax: [phone]  [email or web address]  Open Daily 8:00a - 8:00p    Closed Thanksgiving and Christmas Day  [facility]     Operated by [facility]  [address]  [facility]  [place], [place] [address]  Phone: [phone]  Fax: [phone]  [email or web address]  Open M-F  8:00a - 8:00p  Sat              10:00a - 4:00p  Sun             Closed  Closed all [place] Holidays        Attending Caregiver: Stacie AcresHolly Hartman-Adams, MD      Today's orders: No orders of the defined types were placed in this encounter.       Prescription(s) E-Rx to:  [facility] - [place], [place] - [address]    ________________________________________________________________________  Short Term Disability and Family Medical Leave Act  [facility] does NOT provide assistance with any disability applications.  If you feel your medical condition requires you to be on disability, you will need to follow up with  your primary care physician or a specialist.  We apologize for any inconvenience.    For Medication Prescribed by [facility]:  As an Urgent Care facility, our clinic does NOT offer prescription refills over the telephone.    If you need more of the medication one of our medical providers prescribed, you will  either need to be re-evaluated by us or see your primary care physician.    ________________________________________________________________________      It is very important that we have a phone number.  This is the single best way to contact you in the event that we become aware of important clinical information or concerns after your discharge.  If the phone number you provided at registration is NOT this number you should inform staff and registration prior to leaving.      Your treatment and  evaluation today was focused on identifying and treating potentially emergent conditions based on your presenting signs, symptoms, and history.  The resulting initial clinical impression and treatment plan is not intended to be definitive or a substitute for a full physical examination and evaluation by your primary care provider.  If your symptoms persist, worsen, or you develop any new or concerning symptoms, you need to be evaluated.      If you received x-rays during your visit, be aware that the final and formal interpretation of those films by a radiologist may occur after your discharge.  If there is a significant discrepancy identified after your discharge, we will contact you at the telephone number provided at registration.      If you received a pelvic exam, you may have cultures pending for sexually transmitted diseases.  Positive cultures are reported to the [facility] Department of Health as required by state law.  You may contact the Health Information Management Office of [facility] to get a copy of your results.     If you are over 21 year old, we cannot discuss your personal health information with a parent, spouse, family member, or anyone else without your consent.  This does not include those who have legitimate access to your records and information to assist in your care  under the provisions of HIPAA [facility](Health Insurance Portability and Accountability Act) law, or those to whom you have previously given written consent to do so, such a legal guardian or Power of [place].      Instructions are discussed with patient upon discharge by clinical staff with all questions answered.  Please call [facility] [phone]) if any further questions develop.  Go immediately to the emergency department if any concerns or worsening symptoms.      Stacie AcresHolly Hartman-Adams, MD 01/09/2016, 15:38        How Your Knee Works  A healthy knee bends easily and rotates slightly. The joint absorbs stress and moves  smoothly. This allows you to walk, squat, and turn without pain.    A healthy knee  The knee is a hinge joint, formed where the thighbone (femur) and the shinbone (tibia) meet. It is the largest joint in the body. The joint is covered with smooth tissue and powered by large muscles. When all the parts listed below are healthy, a knee should move easily:   Cartilage is a layer of smooth tissue. It covers the ends of the thighbone and shinbone. It also lines the back side of the kneecap. Healthy cartilage absorbs stress and allows the knee to bend easily.   Muscles power the knee and leg for movement.   Tendons attach the muscles to the bones.   Ligaments are bands of tissue that connect bones and brace the joint.   Bones that make up your knee joint include your thighbone (femur), shinbone (tibia), and kneecap (patella).   Menisci are 2 wedge shaped pieces of cartilage that absorb shock between the thighbone and shinbone.  Date Last Reviewed: 09/30/2013   2000-2017 [employer] [address], [place], [place] [id number]. All rights reserved. This information is not intended as a substitute for professional medical care. Always follow your healthcare professional's instructions.

## 2016-01-15 ENCOUNTER — Ambulatory Visit (INDEPENDENT_AMBULATORY_CARE_PROVIDER_SITE_OTHER): Payer: No Typology Code available for payment source | Admitting: Physical Medicine & Rehabilitation

## 2016-04-22 ENCOUNTER — Ambulatory Visit (INDEPENDENT_AMBULATORY_CARE_PROVIDER_SITE_OTHER): Payer: Self-pay

## 2016-04-22 NOTE — Telephone Encounter (Signed)
Called patient. Call went to voicemail.Message left with call back number. Audry Pili, RN  04/22/2016, 07:50

## 2016-04-22 NOTE — Telephone Encounter (Signed)
Regarding: New Pt   ----- Message from Sinda Du sent at 04/21/2016  4:49 PM EDT -----  Pt stated onset for a few days grey floater, dizzy and thinks it might be lack of sleep but is worried can not tell which eye it is in. He stated has gotten worse over the past day or so. Would like to be seen as soon as possible please return call to [phone].      Thank you.

## 2016-12-09 ENCOUNTER — Encounter (INDEPENDENT_AMBULATORY_CARE_PROVIDER_SITE_OTHER): Payer: No Typology Code available for payment source | Admitting: Emergency Medicine

## 2016-12-13 ENCOUNTER — Ambulatory Visit (INDEPENDENT_AMBULATORY_CARE_PROVIDER_SITE_OTHER): Payer: Self-pay | Admitting: Emergency Medicine

## 2016-12-13 ENCOUNTER — Ambulatory Visit (INDEPENDENT_AMBULATORY_CARE_PROVIDER_SITE_OTHER): Payer: No Typology Code available for payment source | Admitting: Emergency Medicine

## 2017-01-28 LAB — HIV1/HIV2 SCREEN, COMBINED ANTIGEN AND ANTIBODY: HIV SCREEN, COMBINED ANTIGEN & ANTIBODY: NEGATIVE

## 2017-02-21 ENCOUNTER — Ambulatory Visit: Payer: No Typology Code available for payment source | Attending: OPHTHALMOLOGY | Admitting: OPHTHALMOLOGY

## 2017-02-21 ENCOUNTER — Encounter (INDEPENDENT_AMBULATORY_CARE_PROVIDER_SITE_OTHER): Payer: Self-pay | Admitting: OPHTHALMOLOGY

## 2017-02-21 DIAGNOSIS — H52203 Unspecified astigmatism, bilateral: Secondary | ICD-10-CM | POA: Insufficient documentation

## 2017-02-21 DIAGNOSIS — H5213 Myopia, bilateral: Secondary | ICD-10-CM

## 2017-02-21 DIAGNOSIS — R253 Fasciculation: Secondary | ICD-10-CM | POA: Insufficient documentation

## 2017-02-21 NOTE — Progress Notes (Addendum)
[facility]  Operated by [facility]  [address]  [place] [address]  Dept: [phone]    Patient Name: Kirk Gonzalez  MRN#: [id number]  Birthdate: 22-Jan-1995    Date of Service: 02/21/2017    Chief Complaint     New Patient; Eye Exam          Kirk Gonzalez is a 22 y.o. male who presents today for evaluation/consultation of:  HPI     Pt here today after ~2wks of left eye twitching and strain. Pt notes he was prescribed glasses years ago and has never worn them. Pt notes he blinks a few times or massages below OS and twitching goes away. Pt notes area around OS feels swollen, but is not. Pt notes he has been reading a lot. Pt denies any eye pain, fol, or floaters.     Last edited by Jerene Dilling, COA on 02/21/2017  8:09 AM. (History)        ROS     Positive for: Eyes (NPV)    Negative for: Constitutional, Gastrointestinal, Neurological, Skin, Genitourinary, Musculoskeletal, HENT, Endocrine, Cardiovascular, Respiratory, Psychiatric, Allergic/Imm, Heme/Lymph    Last edited by Jerene Dilling, COA on 02/21/2017  8:08 AM. (History)           Jerene Dilling, COA 02/21/2017, 08:09       Past Surgical History:   Procedure Laterality Date   . HX APPENDECTOMY             Past Medical History:   Diagnosis Date   . Asthma            Past Medical History Pertinent Negatives:   Diagnosis Date Noted   . Anxiety 12/17/2014   . Cancer (CMS HCC) 10/26/2014   . Concussion 12/17/2014   . Depression 12/17/2014   . History of dizziness 12/17/2014       There is no problem list on file for this patient.      Family History:  Family Medical History:     Problem Relation (Age of Onset)    Healthy Mother, Father              Social History:     Social History     Tobacco Use   . Smoking status: Never Smoker   . Smokeless tobacco: Never Used   Substance Use Topics   . Alcohol use: Yes     Alcohol/week: 0.0 oz            MD Addition to HPI: Here for evaluation of left lower eyelid twitching.  Has been going on for 2  weeks.  Tries massaging his lid which makes the twitching stop.  Drinks 2 cups of coffee daily - no increase in amount.  Has some family stressors that are worse right now.  Also has been working to get into medical school.  No headaches, fevers, weight loss.      Assessment:    ENCOUNTER DIAGNOSES     ICD-10-CM   1. Eyelid twitch R25.3   2. 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  - if new or  worsening symptoms, return for evaluation.    Myopia/astig  - not interested in MRx today.  20/25 sc    Follow up:    I have asked [name] to follow up in future PRN if symptoms worsen or fail to improve         Letta MedianAllison Grace Kallee Nam, MD 02/21/2017, 09:07    I have seen and examined the above patient. I discussed the above diagnoses listed in the assessment and the above ophthalmic plan of care with the patient and patient's family. All questions were answered. I reviewed and, when necessary, made changes to the technician/resident note, documented ophthalmology exam, chief complaint, history of present illness, allergies, review of systems, past medical, past surgical, family and social history. I personally reviewed and interpreted all testing and/or imaging performed at this visit and agree with the resident's or fellow's interpretation. Any exceptions/additions are edited/noted in the relevant encounter fields.    No orders of the defined types were placed in this encounter.      No orders of the defined types were placed in this encounter.

## 2017-05-03 ENCOUNTER — Encounter (INDEPENDENT_AMBULATORY_CARE_PROVIDER_SITE_OTHER): Payer: Self-pay

## 2017-05-03 ENCOUNTER — Ambulatory Visit (INDEPENDENT_AMBULATORY_CARE_PROVIDER_SITE_OTHER): Payer: No Typology Code available for payment source

## 2017-05-03 VITALS — BP 104/63 | HR 82 | Temp 97.0°F | Resp 14 | Ht 70.0 in | Wt 185.0 lb

## 2017-05-03 DIAGNOSIS — S161XXA Strain of muscle, fascia and tendon at neck level, initial encounter: Secondary | ICD-10-CM

## 2017-05-03 DIAGNOSIS — S46811A Strain of other muscles, fascia and tendons at shoulder and upper arm level, right arm, initial encounter: Secondary | ICD-10-CM

## 2017-05-03 NOTE — Patient Instructions (Signed)
[facility]     Operated by [facility]  [address]  [place], [place] [address]  Phone: [phone]  Fax: [phone]  [email or web address]  Twitter [email or web address]   Closed all  Holidays        Attending Caregiver: [facility]    Today's orders:   Orders Placed This Encounter   . Physical Therapy Referral - External         Prescription(s) E-Rx to:  [facility] - [place], [place] - [address]    Future appts with Student Health  Visit date not found    PLEASE ACTIVATE YOUR [place] MYCHART. THIS IS ONE EASY WAY TO COMMUNICATE WITH [facility].  SEE THE CODE ON THIS FORM FOR ACCESS.    -----------------------------PRIVACY INFORMATION-----------------------------------------------  As a [employer] student, regardless of your age, we cannot discuss your personal health information with a parent, spouse, family member or anyone else without your expressed consent.    This policy does not include:  Individuals who would have a legitimate reason to access your records and information to assist in your care under the provisions of HIPAA [facility](Health Insurance Portability and Accountability Act) law;   Individuals with whom you have previously given expressed written consent to do so, such a legal guardian or Power of [address].    No one can access your My[facility]Chart, unless you give them your account sign on and password. You will receive emails from MY[facility]UChart.  This means anyone who has access to the email account you provided can see this notification. There will be no private medical information included in these emails. This notification of  new medical information  available in your My[facility]UChart, may be information that you do not want others to know.   _______________________________________________________________________    If your symptoms persist, worsen or you develop any new or concerning symptoms please call [facility] for at  [phone] for a follow up appointment.   If your symptoms are severe, go immediately to the emergency department or call 911.  Please check with your health insurance coverage for these types of visits.   Please keep all follow up visit recommended at today's visit.    If you received x-rays during your visit, be aware that the final and formal interpretation of those films by a radiologist will occur after your discharge.  If there is a significant discrepancy identified after your discharge, we will contact you at the telephone number provided during registration.  These results are available for your review on My[facility]UChart.    Please refer to your My[facility]UChart for lab test results. Lab test results related to HIV, STI's and pregnancy are considered private and are therefore not immediately visible in your My[facility]UChart, we may still be able to send a generic MyChart message for these results.  If you have cultures pending for sexually transmitted diseases, you will be contacted by phone.     Positive cultures are reported to the [facility] Department of Health, as required by state law. These results are considered private on My[facility]UChart and can only be released by the provider.We will not contact you if the results are negative.    It is very important that we have a phone number that is the single best way to contact you in the event that we become aware of important clinical information or concerns after your discharge.  If the phone number you provided at registration is NOT this number you should inform staff and  registration prior to leaving.     Please call [facility] at [phone] with any further questions.    Instructions discussed with patient upon discharge by clinical staff with all questions answered.    Berdine AddisonSara Nyzaiah Kai, DO 05/03/2017, 08:48

## 2017-05-03 NOTE — Progress Notes (Signed)
[facility]  [address]  [place] [address]    PATIENT NAME:  Kirk Gonzalez  MRN:  [id number]  DOB:  04/20/1995  DATE OF SERVICE: 05/03/2017    Chief Complaint   Patient presents with   . Shoulder Injury     pain intermittent x several years   . Referrals       History of Present Illness: Kirk Gonzalez is a 22 y.o. male who presents to [facility] today for the above complaint.  HPI    22 year old male presenting with R intermittent shoulder, trapezius, neck pain for the last 3-4 years.  Pt states it has been evaluated for the shoulder pain in the past.  Pt states he has a weaker rotator cuff from swimming, but denies and overt injury at the the time the pain started.  Pt states has had OMM done in the past with some relief, but he cannot drive down to [place] for it on a regular basis.  Pt has also done physical therapy for it in the past (about 1-2 months ago) and would like to extend the physical therapy.  Pt is requesting a referral.  Minimal weakness secondary to pain.  No numbness or tingling.  Pt admits to having x-rays of shoulder done in the past that were WNL.    Past Medical History:    Past Medical History:   Diagnosis Date   . Asthma          Past Surgical History:    Past Surgical History:   Procedure Laterality Date   . HX APPENDECTOMY           Allergies:  Allergies   Allergen Reactions   . Nkda [No Known Drug Allergies]      Medications:  No outpatient medications have been marked as taking for the 05/03/17 encounter (Office Visit) with Daryll Drown, [facility] Provider.     Social History:    Social History     Tobacco Use   . Smoking status: Never Smoker   . Smokeless tobacco: Never Used   Substance Use Topics   . Alcohol use: Yes     Alcohol/week: 0.0 oz      Family History:  Family Medical History:     Problem Relation (Age of Onset)    Healthy Mother, Father            Review of Systems:  Review of Systems   Constitutional: Negative  for fever.   Neurological: Negative for dizziness and numbness.        Minimal weakness of right shoulder     Physical Exam:  Vitals:    05/03/17 0830   BP: 104/63   Pulse: 82   Resp: 14   Temp: 36.1 C (97 F)   TempSrc: Thermal Scan   SpO2: 96%   Weight: 83.9 kg (185 lb)   Height: 1.778 m (5\' 10" )   BMI: 26.6         Body mass index is 26.54 kg/m.  Physical Exam   Constitutional: He appears well-developed and well-nourished. No distress.   Neck: Normal range of motion. Neck supple.   Lymphadenopathy:     He has no cervical adenopathy.   Neurological:   Negative hoffman's test bilaterally.  Negative spurling's test bilaterally.   Skin: He is not diaphoretic.   Nursing note and vitals reviewed.    Right Shoulder Exam  Tenderness   Right shoulder tenderness location: No bony tenderness to palpation of R shoulder.  No spinal tenderness to palpation.  TTP noted along right paracervical spinal muscles and R trapezius.      Range of Motion   The patient has normal right shoulder ROM.  Active abduction: normal   Passive abduction: normal   Extension: normal   External rotation: normal   Forward flexion: normal   Internal rotation 0 degrees: normal   Internal rotation 90 degrees: normal     Muscle Strength   The patient has normal right shoulder strength.  Abduction: 5/5   Internal rotation: 5/5   External rotation: 5/5   Supraspinatus: 5/5   Subscapularis: 5/5   Biceps: 5/5     Tests   Sulcus: absent    Other   Erythema: absent  Scars: absent  Sensation: normal  Pulse: present    Comments:  Negative arc test.        Left Shoulder Exam   Left shoulder exam is normal.            Data Reviewed:  Not applicable    Assessment:   1. Trapezius strain, right, initial encounter    2. Strain of neck muscle, initial encounter      Plan:    Orders Placed This Encounter   . Physical Therapy Referral - External     - Referral for physical therapy provided as above  - Discussed NSAIDs prn and usage of TENS unit  - Follow up with PCP  if pain persists after physical therapy course  - ED precautions discussed    No follow-ups on file.  Berdine AddisonSara Debbi Strandberg, DO

## 2018-01-14 ENCOUNTER — Ambulatory Visit (INDEPENDENT_AMBULATORY_CARE_PROVIDER_SITE_OTHER): Payer: 59 | Admitting: Emergency Medicine

## 2018-01-14 ENCOUNTER — Encounter (INDEPENDENT_AMBULATORY_CARE_PROVIDER_SITE_OTHER): Payer: Self-pay | Admitting: Emergency Medicine

## 2018-01-14 ENCOUNTER — Other Ambulatory Visit (HOSPITAL_BASED_OUTPATIENT_CLINIC_OR_DEPARTMENT_OTHER): Payer: 59

## 2018-01-14 VITALS — BP 130/60 | HR 80 | Temp 98.0°F | Resp 18 | Ht 71.0 in | Wt 191.0 lb

## 2018-01-14 DIAGNOSIS — J45990 Exercise induced bronchospasm: Secondary | ICD-10-CM

## 2018-01-14 DIAGNOSIS — R0602 Shortness of breath: Secondary | ICD-10-CM

## 2018-01-14 DIAGNOSIS — H1032 Unspecified acute conjunctivitis, left eye: Secondary | ICD-10-CM

## 2018-01-14 DIAGNOSIS — H109 Unspecified conjunctivitis: Secondary | ICD-10-CM

## 2018-01-14 MED ORDER — OFLOXACIN 0.3 % EYE DROPS: 1 [drp] | Bottle | OPHTHALMIC | 0 refills | 0 days | Status: AC

## 2018-01-14 MED ORDER — ALBUTEROL SULFATE HFA 90 MCG/ACTUATION AEROSOL INHALER
2.0000 | INHALATION_SPRAY | RESPIRATORY_TRACT | 0 refills | Status: AC | PRN
Start: 2018-01-14 — End: ?

## 2018-01-14 NOTE — Progress Notes (Signed)
History of Present Illness: Kirk Gonzalez is a 23 y.o. male who presents to the [facility] today with chief complaint of    Chief Complaint            Eye Irritation left side     Chest Congestion would lke an inhaler         Pt presents to [facility] with c/o left eye irritation onset yesterday afternoon. He sts that he has noticed her sxs tend to be worse at nighttime. Pt reports that he does not wear contact lenses. He sts that he has not worn his glasses consistently fort he past 2 years, but 2 weeks ago he began wearing his glasses consistent. Pt reports that he has had no known sick contacts with similar sxs recently. He associates redness. Pt denies fever, chills and pruritus. Of note, patient reports that he has a history of asthma and he has been experiencing some chest congestion and would like a refill of his inhaler.          Functional Health Screening:    Patient is under 18:  No  Have you had a recent unexplained weight loss or gain?:  No  Because we are aware of abuse and domestic violence today, we ask all patients: Are you being hurt, hit, or frightened by anyone at your home or in your life?:  No  Do you have any basic needs within your home that are not being met? (such as Food, Shelter, Games developer, Transportation):  No  Patient is under 18 and therefore has no Advance Directives:  No       I reviewed and confirmed the patient's past medical history taken by the nurse or medical assistant with the addition of the following:    Past Medical History:    Past Medical History:   Diagnosis Date   . Asthma      Past Surgical History:    Past Surgical History:   Procedure Laterality Date   . HX APPENDECTOMY       Allergies:  Allergies   Allergen Reactions   . Nkda [No Known Drug Allergies]      Medications:    Current Outpatient Medications   Medication Sig   . albuterol sulfate (PROAIR HFA) 90 mcg/actuation Inhalation HFA Aerosol Inhaler Take 2 Puffs by inhalation Every 4 hours as needed   .  ofloxacin (OCUFLOX) 0.3 % Ophthalmic Drops Instill 1-2 Drops into left eye Every 4 hours for 5 days     Social History:    Social History     Tobacco Use   . Smoking status: Never Smoker   . Smokeless tobacco: Never Used   Substance Use Topics   . Alcohol use: Yes     Alcohol/week: 0.0 standard drinks   . Drug use: No     Family History: No significant family history.  Family Medical History:     Problem Relation (Age of Onset)    Healthy Mother, Father        Review of Systems:    General: no fever and no chills  Eyes:  left eye irritation, redness and no pruritus  Pulmonary: chest congestion  All other review of systems were negative    Physical Exam:  Vital signs:   Vitals:    01/14/18 1412   BP: 130/60   Pulse: 80   Resp: 18   Temp: 36.7 C (98 F)   TempSrc: Thermal Scan   SpO2: 99%  Weight: 86.6 kg (191 lb)   Height: 1.803 m ('5\' 11"' )   BMI: 26.69     Body mass index is 26.64 kg/m. Facility age limit for growth percentiles is 20 years.  No LMP for male patient.    General:  Well appearing and no acute distress  Head:  Normocephalic  Eyes: Mild erythema over the medial portion of the left sclera present, PERRLA, EOMI and no increased uptake on fluorescein staining  Neck:  Supple  Pulmonary:  Clear to auscultation bilaterally, no wheezes, no rales and no rhonchi  Cardiovascular:  Regular rate/rhythm, normal S1/S2, no murmur/rub/gallop and distal pulses of bilateral upper extremities intact  Skin:  Warm/dry and no rash  Psychiatric:  Appropriate affect and behavior  Neurologic:   Alert and oriented x 3    Data Reviewed:    Radiography: 01/14/2018 CXR PA & Lateral reviewed by Dr. Raynelle Dick at 1517, no acute process    Course: Condition at discharge: Good     Differential Diagnosis: corneal abrasion vs conjunctivitis vs allergies     Assessment:   1. Conjunctivitis, unspecified conjunctivitis type, unspecified laterality    2. SOB (shortness of breath)    3. Exercise-induced asthma      Plan:    Orders Placed This  Encounter   . CXR PA and Lat   . ofloxacin (OCUFLOX) 0.3 % Ophthalmic Drops   . albuterol sulfate (PROAIR HFA) 90 mcg/actuation Inhalation HFA Aerosol Inhaler         X-ray obtained in clinic and findings were discussed with patient.  Will await final radiology read and if differing from reading obtained today will contact and update patient.  Apply Ocuflox and use Albuterol inhaler as directed.    Go to Emergency Department immediately for further work up if any concerning symptoms.  Plan was discussed and patient verbalized understanding. If symptoms are worsening or not improving the patient should return to the [facility] for further evaluation.    I am scribing for, and in the presence of, Dr. Raynelle Dick for services provided on 01/14/2018.  Pleas Patricia, SCRIBE     Adam Gorden Harms, Christiana 01/14/2018, 15:18    I personally performed the services described in this documentation, as scribed  in my presence, and it is both accurate  and complete.    Starlyn Skeans, MD

## 2018-01-14 NOTE — Patient Instructions (Addendum)
Conjunctivitis, Nonspecific    The membrane that coversthe white part ofyour eye (the conjunctiva) is inflamed. Inflammation happens when your body responds to an injury, allergic reaction, infection, or illness. Symptoms of inflammation in the eye may include redness, irritation, itching, swelling, or burning. These symptoms should go away within the next 24 hours. Conjunctivitis may be related to a particle that was in your eye. If so, itmay washout with your tears or irrigation treatment. Being exposed to liquid chemicals or fumes may also cause this reaction.  Home care   Apply a cold pack over the eye for 20 minutes at a time. This will reduce pain. To make a cold pack, put ice cubes in a plastic bag that seals at the top. Wrap the bag in a clean, thin towel or cloth.   Artificial tears may be prescribed to reduce irritation or redness. These should be used 3 to 4 times a day.   You may use acetaminophen or ibuprofen to control pain, unless another medicine was prescribed. (Note: If you have chronic liver or kidney disease, or if you have ever had a stomach ulcer or gastrointestinal bleeding, talk with your healthcare provider before using these medicines.)  Follow-up care  Follow up with your healthcare provider, or as advised.  When to seek medical advice  Call your healthcare provider right away if any of these occur:   Increased eyelid swelling   Increased eye pain   Increased redness or drainage from the eye   Increased blurry vision or increased sensitivity to light   Failure of normal vision to return within 24 to 48 hours  [employer]Well last reviewed this educational content on 07/12/2015   2000-2019 [employer] [address], [place] [address]. All rights reserved. This information is not intended as a substitute for professional medical care. Always follow your healthcare professional's instructions.

## 2018-03-12 ENCOUNTER — Encounter (INDEPENDENT_AMBULATORY_CARE_PROVIDER_SITE_OTHER): Payer: Self-pay

## 2018-03-12 ENCOUNTER — Other Ambulatory Visit: Payer: Self-pay

## 2018-03-12 ENCOUNTER — Encounter (FREE_STANDING_LABORATORY_FACILITY)
Admit: 2018-03-12 | Discharge: 2018-03-12 | Disposition: A | Discharge status: Home / Self Care | Payer: 59 | Attending: Physician Assistant | Admitting: Physician Assistant

## 2018-03-12 ENCOUNTER — Ambulatory Visit (INDEPENDENT_AMBULATORY_CARE_PROVIDER_SITE_OTHER): Payer: 59

## 2018-03-12 ENCOUNTER — Encounter (FREE_STANDING_LABORATORY_FACILITY): Payer: 59 | Admitting: Physician Assistant

## 2018-03-12 VITALS — BP 128/77 | HR 71 | Temp 97.6°F | Resp 20 | Ht 71.0 in | Wt 197.3 lb

## 2018-03-12 DIAGNOSIS — N50819 Testicular pain, unspecified: Secondary | ICD-10-CM | POA: Insufficient documentation

## 2018-03-12 NOTE — Progress Notes (Signed)
History of Present Illness: Kirk Gonzalez is a 23 y.o. male who presents [facility] the [facility] today with chief complaint of    Chief Complaint            Testicle Pain left for 3 days    Groin Pain left      .   23 yo M cc: dull L testicle pain x 3 days. Denies dysuria, hematuria and discharge. Pain is constant. He is a Engineer, manufacturing systems and concerned for varicocele. Not taking anything for his symptoms.   Patient states he is not currently sexually active.  Notes that pain is mild and if he is working or stating he does not notice it.    I reviewed and confirmed the patient's past medical history taken by the nurse or medical assistant with the addition of the following:    Past Medical History:    Past Medical History:   Diagnosis Date   . Asthma          Past Surgical History:    Past Surgical History:   Procedure Laterality Date   . HX APPENDECTOMY           Allergies:  Allergies   Allergen Reactions   . Nkda [No Known Drug Allergies]      Medications:    Current Outpatient Medications   Medication Sig   . albuterol sulfate (PROAIR HFA) 90 mcg/actuation Inhalation HFA Aerosol Inhaler Take 2 Puffs by inhalation Every 4 hours as needed     Social History:    Social History     Tobacco Use   . Smoking status: Never Smoker   . Smokeless tobacco: Never Used   Substance Use Topics   . Alcohol use: Yes     Alcohol/week: 0.0 standard drinks   . Drug use: No     Family History: No significant family history.  Family Medical History:     Problem Relation (Age of Onset)    Healthy Mother, Father            Review of Systems:    General: no fever and no myalgias  Gastrointestinal:  no nausea, no vomiting, no diarrhea and no abdominal pain  Genitourinary:  no dysuria, no frequency, no urgency and no flank pain  All other review of systems were negative     Physical Exam:  Vital signs:   Vitals:    03/12/18 0840   BP: 128/77   Pulse: 71   Resp: 20   Temp: 36.4 C (97.6 F)   TempSrc: Tympanic   SpO2: 98%   Weight: 89.5  kg (197 lb 5 oz)   Height: 1.803 m (5\' 11" )   BMI: 27.58             General:  Well appearing and no acute distress  Head:  Normocephalic  Eyes:  Normal lids/lashes and normal conjunctiva  ENT:MMM, normal pharynx/tonsils and normal tongue/uvula  Neck:  Supple  Pulmonary:  Clear to auscultation bilaterally, no wheezes, no rales and no rhonchi  Cardiovascular:  Regular rate/rhythm, normal S1/S2 and no murmur/rub/gallop  Gastrointestinal:  Non-distended, soft, non-tender and no guarding  GU:  Very mild left testicular tenderness with mild edema of left testicle compared to right.  NO inguinal masses.  Normal appearing penis circumcised male.  Skin:  Warm/dry and no rash  Psychiatric:  Appropriate affect and behavior  Neurologic:   Alert and oriented x 3, no meningeal signs  Data Reviewed:      Point-of-care testing:  Time collected: 0909  Leukocytes: Negative  Nitrite: Negative  Urobilinogen: Normal   Protein: Negative  pH: 6.0  Blood (urine): Negative  Urine Specific Gravity: 1.020  Ketones: Negative  Bilirubin: Negative  Glucose: Negative                         Course: Condition at discharge: Good     Differential Diagnosis:   Varicocele hydrocele, testicular cancer, less likely torsion.    Assessment:   1. Testicular pain        Plan:    Orders Placed This Encounter   . URINE CULTURE   . US SCROTUM   . NEISSERIA GONORRHOEAE DNA BY PCR   . CHLAMYDIA TRACHOMITIS DNA BY PCR (INHOUSE)   . POCT AUTOMATED RACK URINE WITHOUT MICROSCOPY        Patient has very little pain on exam.  Low clinical suspicion for torsion however he does need a testicular ultrasound.  As we are unable to arrange is today we will make arrangements a have him get it 1st thing tomorrow.  If his pain gets worse at any time will go to the ED for emergent ultrasound to rule out torsion.  We discussed torsion will present with significant increasing pain in can be dangerous.  The    Go to Emergency Department immediately for further work up if any  concerning symptoms.  Plan was discussed and patient verbalized understanding.  If symptoms are worsening or not improving the patient should return to the [facility]gent [facility] for further evaluation.      The co-signing faculty was physically present in Urgent Care[facility] and available for consultation and did not particpate in the care of this patient.      Hassell Done, PA-C 03/12/2018, 09:15    Milagros Loll, MD  03/12/2018, 13:14

## 2018-03-12 NOTE — Patient Instructions (Signed)
Testicular Pain, Unclear Cause  You have had pain inone or both testicles. Based on your exam today, the exact cause of your pain is not certain. But your condition doesn't appear to be dangerous. Testicles are very sensitive. Even a small injury can cause quite a bit of pain. Other possible causes of testicular pain include kidney stones, cysts, mumps, inflammatory conditions, chronic conditions, hernia, infection, and a twisted testicle.   Certain tests may be done to rule out an underlying problem causing the pain. Nothing conclusive was found today. Most likely, the pain will go away on its own. If it doesn't, you may need more tests.     Home care  Medicine may be prescribed to help relieve pain and swelling. This may be an over-the-counter pain reliever or prescription pain medicine. Take all medicine as directed.   The following are general care guidelines:    To relieve pain and swelling, apply an ice pack wrapped in a thin towel for 10 minutes at a time. Continue this on and off for 1 to 2 days.   When lying down, place a small rolled towel under your scrotum. When moving around, wear a jockstrap (athletic supporter) or supportive underwear. These will help support and protect your testicles.   If it hurts to walk, walk as little as possible until you feel better.   Don't do any strenuous activity until you feel better.   Don't have sex until you feel better.   If you have severe pain in the testicle, seek care right away. Delay may lead to permanent loss of the testicle's function.  Follow-up care  Follow up with your healthcare provider, or as advised.   When to seek medical advice  Call your healthcare provider right away if any of these occur:    Fever of 100.62F (38C) or higher, or as directed by your provider   Worsening of the pain or severe pain   Swelling of the testicle or scrotum   A lump in the scrotum   Warm and red scrotum (signs of infection)   Nausea and vomiting   Pain or  swelling in abdomen   Trouble peeing   Numbness or weakness in the leg   Shrinking of the testicle   Blood in your urine  StayWell last reviewed this educational content on 10/12/2014   2000-2019 [employer] [place]. [address] [place], [place] [address]. All rights reserved. This information is not intended as a substitute for professional medical care. Always follow your healthcare professional's instructions.      [facility]     Operated by [facility]  [address]  [place], [place] [address]  Phone: [phone])  Fax: [phone]  [email or web address]  Open Daily 8:00a - 8:00p     Closed Thanksgiving and Christmas Day     Today's orders:   Orders Placed This Encounter   . URINE CULTURE   . US SCROTUM   . NEISSERIA GONORRHOEAE DNA BY PCR   . CHLAMYDIA TRACHOMITIS DNA BY PCR (INHOUSE)   . POCT AUTOMATED RACK URINE WITHOUT MICROSCOPY        Prescription(s) E-Rx to:  [facility] - [place], [place] - [address]  ______________________________________________________________________  Short Term Disability and Family Medical Leave Act  [facility] does NOT provide assistance with any disability applications.  If you feel your medical condition requires you to be on disability, you will need to follow up with  Your primary care physician or a specialist.  We apologize for any inconvenience.    For Medication Prescribed by [facility]:  As an Urgent Care facility, our clinic does NOT offer prescription refills over the telephone.    If you need more of the medication one of our medical providers prescribed, you will  Either need to be re-evaluated by [place] or see your primary care physician.    ________________________________________________________________________      It is very important that we have a phone number that is the single best way to contact you in the event that we become aware of important clinical information or concerns after  your discharge.  If the phone number you provided at registration is NOT this number you should inform staff and registration prior to leaving.      Your treatment and evaluation today was focused on identifying and treating potentially emergent conditions based on your presenting signs, symptoms, and history.  The resulting initial clinical impression and treatment plan is not intended to be definitive or a substitute for a full physical examination and evaluation by your primary care provider.  If your symptoms persist, worsen, or you develop any new or concerning symptoms, you need to be evaluated.      If you received x-rays during your visit, be aware that the final and formal interpretation of those films by a radiologist may occur after your discharge.  If there is a significant discrepancy identified after your discharge, we will contact you at th telephone number provided at registration.      If you received a pelvic exam, you may have cultures pending for sexually transmitted diseases.  Positive cultures are reported to the [facility] Department of Health as required by state law.  You should be contacted if you cultures are positive.  We will not contact you if they are negative.  You may contact the Health Information Management Office of [facility] to get a copy of your results.  You did NOT receive a PAP smear (the screening test for cervical).  This specific test for women is best performed by your gynecologist or primary care provider when indicated.      If you are over 74 year old, we cannot discuss your personal health information with a parent, spouse, family member, or anyone else without your express consent.  This does not include those who have legitimate access to your records and information to assist in your care under the provisions of HIPAA [facility] Portability and Accountability Act) law, or those to whom you have previously given express written consent to do so, such a  legal guardian or Power of [place].      You may have received medication that may cause you to feel drowsy and/or light headed for several hours.  You may even experience some amnesia of your stay.  You should avoid operating a motor vehicle or performing any activity requiring complete alertness or coordination until you feel fully awake (approximately 24-48 hours).  Avoid alcoholic beverages.  You may also have a dry mouth for several hours.  This is a normal side effect and will disappear as the effects of the medication wear off.      Instructions discussed with patient upon discharge by clinical staff with all questions answered.  Please call [facility] [phone]) if any further questions.  Go immediately to the emergency department if any concern or worsening symptoms.      Madelaine Bhat  Patrina LeveringB Weslie Pretlow, PA-C 03/12/2018, 09:10

## 2018-03-13 ENCOUNTER — Ambulatory Visit
Admission: RE | Admit: 2018-03-13 | Discharge: 2018-03-13 | Disposition: A | Discharge status: Home / Self Care | Payer: 59 | Source: Ambulatory Visit | Attending: Physician Assistant | Admitting: Physician Assistant

## 2018-03-13 DIAGNOSIS — N50819 Testicular pain, unspecified: Secondary | ICD-10-CM | POA: Insufficient documentation

## 2018-03-13 DIAGNOSIS — R9389 Abnormal findings on diagnostic imaging of other specified body structures: Secondary | ICD-10-CM | POA: Insufficient documentation

## 2018-03-13 LAB — URINE CULTURE: URINE CULTURE: NO GROWTH

## 2018-03-13 LAB — CHLAMYDIA TRACHOMITIS DNA BY PCR (INHOUSE): CHLAMYDIA TRACHOMATIS PCR: NOT DETECTED

## 2018-03-13 LAB — NEISSERIA GONORRHOEAE DNA BY PCR: NEISSERIA GONORRHOEAE PCR: NOT DETECTED

## 2018-06-13 ENCOUNTER — Ambulatory Visit (INDEPENDENT_AMBULATORY_CARE_PROVIDER_SITE_OTHER): Payer: 59

## 2018-06-13 ENCOUNTER — Encounter (INDEPENDENT_AMBULATORY_CARE_PROVIDER_SITE_OTHER): Payer: Self-pay | Admitting: Emergency Medicine

## 2018-06-13 ENCOUNTER — Encounter (INDEPENDENT_AMBULATORY_CARE_PROVIDER_SITE_OTHER): Payer: Self-pay

## 2018-06-13 ENCOUNTER — Other Ambulatory Visit: Payer: Self-pay

## 2018-06-13 VITALS — BP 132/89 | HR 66 | Temp 98.0°F | Resp 17 | Ht 71.26 in | Wt 188.2 lb

## 2018-06-13 DIAGNOSIS — R1032 Left lower quadrant pain: Secondary | ICD-10-CM

## 2018-06-13 DIAGNOSIS — M25552 Pain in left hip: Secondary | ICD-10-CM

## 2018-06-13 DIAGNOSIS — N50812 Left testicular pain: Secondary | ICD-10-CM

## 2018-06-13 MED ORDER — NAPROXEN 500 MG TABLET
500.00 mg | ORAL_TABLET | Freq: Two times a day (BID) | ORAL | 0 refills | Status: AC
Start: 2018-06-13 — End: 2018-06-20

## 2018-06-13 NOTE — Progress Notes (Signed)
History of Present Illness: Kirk Gonzalez is a 23 y.o. male who presents to the [facility] today with chief complaint of    Chief Complaint            Testicular Pain left. same issue 1 month ago went to [facility] ER had [facility]. pain returned 1 week ago         Pt presents to [facility] with c/o L side testicular pain onset 1 week ago. He was previously seen at [facility] on 03/12/2018 for the same testicular pain, where he was given an order for an ultrasound to rule out testicular torsion. Pt's ultrasound revealed normal testicles and epididymis bilaterally but there were two small foci in the L epididymal head that represented a cyst or spermatocele. His urine rack and culture were clean. Pt also tested negative for Gonorrhea and Chlamydia.     Today, he mentions that his pain has returned about 1 week ago. The pain is in the same spot in his testicle, but it has also moved up towards his pubic region. He thought that his testicle was not in his scrotum so he "had to push it back down into his scrotum." Pt first notice his testicle ascend while ejaculating and ejaculating does seem to exacerbate his sxs very slightly. He describes the pain as "somewhere between dull and sharp and feels like someone is squeezing his testicle." He rates the pain as a 6-7/10 when sitting and is about a 4-5/10 when laying flat. 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 For his sxs, he has been taking Ibuprofen and using ice packs with no relief. At this time, pt associates L testicle pain, mild pain with ejaculation, and intermittent L groin pain. He denies fever, abdominal pain, nausea, vomiting, diarrhea, dysuria, testicular swelling, penile discharge, hematuria, flank pain, and genital lesions.     Also of  importance, pt has been experiencing L hip pain onset ~1 year ago. He is not sure if it is correlated to his testicle pain but he thought it was important enough to mention it. He denies any associated weakness, numbness or tingling with the hip pain.         Functional Health Screening:    Patient is under 18:  No  Have you had a recent unexplained weight loss or gain?:  No  Because we are aware of abuse and domestic violence today, we ask all patients: Are you being hurt, hit, or frightened by anyone at your home or in your life?:  No  Do you have any basic needs within your home that are not being met? (such as Food, Shelter, Games developer, Transportation):  No  Patient is under 18 and therefore has no Advance Directives:  No         I reviewed and confirmed the patient's past medical history taken by the nurse or medical assistant with the addition of the following:    Past Medical History:    Past Medical History:   Diagnosis Date   . Asthma      Past Surgical History:    Past Surgical History:   Procedure Laterality Date   . HX APPENDECTOMY       Allergies:  Allergies   Allergen Reactions   . Ardine Bjork  Known Drug Allergies]      Medications:    Current Outpatient Medications   Medication Sig   . albuterol sulfate (PROAIR HFA) 90 mcg/actuation Inhalation HFA Aerosol Inhaler Take 2 Puffs by inhalation Every 4 hours as needed   . naproxen (NAPROSYN) 500 mg Oral Tablet Take 1 Tab (500 mg total) by mouth Twice daily with food for 7 days     Social History:    Social History     Tobacco Use   . Smoking status: Never Smoker   . Smokeless tobacco: Never Used   Substance Use Topics   . Alcohol use: Yes     Alcohol/week: 0.0 standard drinks   . Drug use: No     Family History: No significant family history.  Family Medical History:     Problem Relation (Age of Onset)    Healthy Mother, Father        Review of Systems:     General: no fever  Gastrointestinal: no abdominal pain, no nausea, no vomiting, no diarrhea      Genitourinary: L testicle pain, mild pain with ejaculation, intermittent L groin pain, no dysuria, no testicular swelling, no penile discharge, no hematuria, no flank pain   Skin: no genital lesions   All other review of systems were negative.     Physical Exam:  Vital signs:   Vitals:    06/13/18 1509   BP: 132/89   Pulse: 66   Resp: 17   Temp: 36.7 C (98 F)   TempSrc: Thermal Scan   SpO2: 98%   Weight: 85.4 kg (188 lb 3.2 oz)   Height: 1.81 m (5' 11.26")   BMI: 26.11     Body mass index is 26.06 kg/m. Facility age limit for growth percentiles is 20 years.  No LMP for male patient.    General:  Well appearing and no acute distress  Gastrointestinal:  Non-distended, soft, non-tender and no guarding  Genitourinary: Testicle exam chaperoned by Roseanne Kaufman, RN: TTP along L head of epididymis and along lateral aspect of L testicle, pain extended into inguinal canal but there was no inguinal hernia noted with valsalva, no inguinal lymphadenopathy, normal external male genitalia with no rashes/lesions present   Musculoskeletal: TTP at [facility] joint on L side, pain also with palpation at the greater trochanter extending down into L IT band, no bony midline spinal tenderness, full ROM of lumbar spine with FROM and 5/5 muscle strength of UE and LE bilaterally, Negative FABER and FADIR testing  Skin:  Warm/dry and no rash  Psychiatric:  Appropriate affect and behavior  Vascular: LE pulses intact with brisk cap refill  Neurologic:   Alert and oriented x 3, LE sensation grossly intact and symmetric, patellar DTR intact and symmetric    Data Reviewed:  Point-of-care testing:      Point of care Urine rack  Completed by nursing and reviewed by myself and was WNL, no evidence of WBC, RBC, nitrites or protein.                      Course: Condition at discharge: Good     Differential Diagnosis: epididymitis vs inguinal hernia vs muscle strain    Assessment:   1. Testicular pain, left    2. Left inguinal pain    3. Left hip pain         Plan:    Orders Placed This Encounter   . [place] GROIN   . CANCELED: Referral  to Urology   . OUTSIDE CONSULT/REFERRAL PROVIDER(AMB)   . Physical Therapy Referral - External   . POCT AUTOMATED RACK URINE WITHOUT MICROSCOPY   . naproxen (NAPROSYN) 500 mg Oral Tablet         Pt declined prostate exam in clinic today.  Inguinal ultrasound order placed to rule out hernia, however, lower suspicion as no over hernia noted on exam  Pt given referral to Urology for further evaluation. An external referral was placed as he wants to see a Dealer in his hometown.    As for his hip pain suspect trochanteric bursitis vs IT band syndrome, he was given information on different exercises/stretches to perform.   Pt was also given a referral to physical therapy and a Rx for Naproxen. Educated pt on use and take with food.     Follow up if symptoms persist     Go to Emergency Department immediately for further work up if any concerning symptoms.  Plan was discussed and patient verbalized understanding. If symptoms are worsening or not improving the patient should return to the [facility] for further evaluation.    I am scribing for, and in the presence of, Dr. Guy Franco for services provided on 06/13/2018.  Danley Danker, SCRIBE     Danley Danker, SCRIBE 06/20/2018, 19:34    I personally performed the services described in this documentation, as scribed  in my presence, and it is both accurate  and complete.    Guy Franco, DO

## 2018-06-14 ENCOUNTER — Telehealth (INDEPENDENT_AMBULATORY_CARE_PROVIDER_SITE_OTHER): Payer: Self-pay

## 2018-06-14 NOTE — Telephone Encounter (Signed)
Patient has decided to see a urologist at [facility] in [place]. I instructed patient to contact medical records so they could forward his medical records to the provider he wants to see. I also gave him directions as to where medical records was located.

## 2018-06-20 ENCOUNTER — Encounter (INDEPENDENT_AMBULATORY_CARE_PROVIDER_SITE_OTHER): Payer: Self-pay | Admitting: Emergency Medicine

## 2018-11-10 ENCOUNTER — Ambulatory Visit (HOSPITAL_BASED_OUTPATIENT_CLINIC_OR_DEPARTMENT_OTHER)
Admission: RE | Admit: 2018-11-10 | Discharge: 2018-11-10 | Disposition: A | Discharge status: Home / Self Care | Payer: 59 | Source: Ambulatory Visit

## 2018-11-10 ENCOUNTER — Other Ambulatory Visit: Payer: Self-pay

## 2018-11-10 ENCOUNTER — Encounter (INDEPENDENT_AMBULATORY_CARE_PROVIDER_SITE_OTHER): Payer: Self-pay

## 2018-11-10 ENCOUNTER — Encounter (FREE_STANDING_LABORATORY_FACILITY)
Admit: 2018-11-10 | Discharge: 2018-11-10 | Disposition: A | Discharge status: Home / Self Care | Payer: 59 | Attending: Emergency Medicine | Admitting: Emergency Medicine

## 2018-11-10 ENCOUNTER — Ambulatory Visit
Admission: RE | Admit: 2018-11-10 | Discharge: 2018-11-10 | Disposition: A | Discharge status: Home / Self Care | Payer: 59 | Source: Ambulatory Visit | Attending: Emergency Medicine | Admitting: Emergency Medicine

## 2018-11-10 ENCOUNTER — Ambulatory Visit (INDEPENDENT_AMBULATORY_CARE_PROVIDER_SITE_OTHER): Payer: 59

## 2018-11-10 ENCOUNTER — Encounter (FREE_STANDING_LABORATORY_FACILITY): Payer: 59 | Admitting: Emergency Medicine

## 2018-11-10 VITALS — BP 140/80 | HR 70 | Temp 98.0°F | Resp 18 | Ht 71.0 in | Wt 190.0 lb

## 2018-11-10 DIAGNOSIS — M542 Cervicalgia: Secondary | ICD-10-CM

## 2018-11-10 DIAGNOSIS — R0989 Other specified symptoms and signs involving the circulatory and respiratory systems: Secondary | ICD-10-CM

## 2018-11-10 DIAGNOSIS — I878 Other specified disorders of veins: Secondary | ICD-10-CM

## 2018-11-10 LAB — CBC WITH DIFF
BASOPHIL #: <0.1 10*3/uL (ref <=0.20)
BASOPHIL %: 1 %
EOSINOPHIL #: 0.19 10*3/uL (ref <=0.50)
EOSINOPHIL %: 3 %
HCT: 44.6 % (ref 38.9–52.0)
HGB: 15.9 g/dL (ref 13.4–17.5)
IMMATURE GRANULOCYTE #: <0.1 10*3/uL (ref <0.10)
IMMATURE GRANULOCYTE %: 0 % (ref 0–1)
LYMPHOCYTE #: 2.59 10*3/uL (ref 1.00–4.80)
LYMPHOCYTE %: 36 %
MCH: 29.7 pg (ref 26.0–32.0)
MCHC: 35.7 g/dL — ABNORMAL HIGH (ref 31.0–35.5)
MCV: 83.2 fL (ref 78.0–100.0)
MONOCYTE #: 0.49 10*3/uL (ref 0.20–1.10)
MONOCYTE %: 7 %
MPV: 10.3 fL (ref 8.7–12.5)
NEUTROPHIL #: 3.8 10*3/uL (ref 1.50–7.70)
NEUTROPHIL %: 53 %
PLATELETS: 251 10*3/uL (ref 150–400)
RBC: 5.36 10*6/uL (ref 4.50–6.10)
RDW-CV: 11.4 % — ABNORMAL LOW (ref 11.5–15.5)
WBC: 7.1 10*3/uL (ref 3.7–11.0)

## 2018-11-10 LAB — COMPREHENSIVE METABOLIC PANEL, NON-FASTING
ALBUMIN: 4.9 g/dL (ref 3.5–5.0)
ALKALINE PHOSPHATASE: 86 U/L (ref 45–115)
ALT (SGPT): 19 U/L (ref <55)
ANION GAP: 11 mmol/L (ref 4–13)
AST (SGOT): 22 U/L (ref 8–48)
BILIRUBIN TOTAL: 0.6 mg/dL (ref 0.3–1.3)
BUN/CREA RATIO: 20 (ref 6–22)
BUN: 22 mg/dL (ref 8–25)
CALCIUM: 9.5 mg/dL (ref 8.5–10.2)
CHLORIDE: 100 mmol/L (ref 96–111)
CO2 TOTAL: 28 mmol/L (ref 22–32)
CREATININE: 1.11 mg/dL (ref 0.62–1.27)
ESTIMATED GFR: >60 mL/min/{1.73_m2} (ref >60)
GLUCOSE: 86 mg/dL (ref 65–139)
POTASSIUM: 3.9 mmol/L (ref 3.5–5.1)
PROTEIN TOTAL: 8.8 g/dL — ABNORMAL HIGH (ref 6.4–8.3)
SODIUM: 139 mmol/L (ref 136–145)

## 2018-11-10 LAB — C-REACTIVE PROTEIN(CRP),INFLAMMATION: CRP INFLAMMATION: <1 mg/L (ref <=8.0)

## 2018-11-10 LAB — HIV1/HIV2 SCREEN, COMBINED ANTIGEN AND ANTIBODY: HIV SCREEN, COMBINED ANTIGEN & ANTIBODY: NEGATIVE

## 2018-11-10 LAB — B-TYPE NATRIURETIC PEPTIDE (BNP),PLASMA: BNP: <10 pg/mL (ref <=100)

## 2018-11-10 LAB — THYROID STIMULATING HORMONE WITH FREE T4 REFLEX: TSH: 2.677 u[IU]/mL (ref 0.350–5.000)

## 2018-11-10 LAB — HEPATITIS C ANTIBODY SCREEN WITH REFLEX TO HCV PCR: HCV ANTIBODY QUALITATIVE: NEGATIVE

## 2018-11-10 NOTE — Progress Notes (Addendum)
History of Present Illness: Kirk Gonzalez is a 23 y.o. male who presents to the [facility] today with chief complaint of    Chief Complaint            Neck Pain on lef t side         Pt presents to the [facility] with c/o L neck edema onset 3 weeks ago. Pt sts he feels like his "left veins are more prominent than the right veins" in his neck. He describes the pain as burning that worsens when lying on his R side. He reports no known exposures to COVID-19. Pt reports a familial Hx of cardiac issues and reports he is concerned for cardiac issues. Pt associates mild fatigue and L neck pain. Pt denies headache, fever, chills, nausea, vomiting, and lightheadedness.    Functional Health Screening:    Patient is under 18:  No  Have you had a recent unexplained weight loss or gain?:  No  Because we are aware of abuse and domestic violence today, we ask all patients: Are you being hurt, hit, or frightened by anyone at your home or in your life?:  No  Do you have any basic needs within your home that are not being met? (such as Food, Shelter, Games developer, Transportation):  No  Patient is under 18 and therefore has no Advance Directives:  No       I reviewed and confirmed the patient's past medical history taken by the nurse or medical assistant with the addition of the following:    Past Medical History:    Past Medical History:   Diagnosis Date   . Asthma      Past Surgical History:    Past Surgical History:   Procedure Laterality Date   . HX APPENDECTOMY       Allergies:  Allergies   Allergen Reactions   . Nkda [No Known Drug Allergies]      Medications:    Current Outpatient Medications   Medication Sig   . albuterol sulfate (PROAIR HFA) 90 mcg/actuation Inhalation HFA Aerosol Inhaler Take 2 Puffs by inhalation Every 4 hours as needed     Social History:    Social History     Tobacco Use   . Smoking status: Never Smoker   . Smokeless tobacco: Never Used   Substance Use Topics   . Alcohol use: Yes     Alcohol/week: 0.0  standard drinks   . Drug use: No     Family History: No significant family history.  Family Medical History:     Problem Relation (Age of Onset)    Healthy Mother, Father        Review of Systems:    General: mild fatigue, no fever, no chills  Gastrointestinal: no nausea and no vomiting  Musculoskeletal: L neck pain  Neurologic: no headache, no lightheadedness  Skin: L neck edema  All other review of systems were negative    Physical Exam:  Vital signs:   Vitals:    11/10/18 1456   BP: 140/80   Pulse: 70   Resp: 18   Temp: 36.7 C (98 F)   TempSrc: Thermal Scan   SpO2: 99%   Weight: 86.2 kg (190 lb)   Height: 1.803 m ('5\' 11"'$ )   BMI: 26.56     Body mass index is 26.5 kg/m. Facility age limit for growth percentiles is 20 years.  No LMP for male patient.    General:  Well appearing and  No acute distress  Head:  Normocephalic and atraumatic  Eyes:  Normal lids/lashes, normal conjunctiva  Neck:  supple  Pulmonary:  clear to auscultation bilaterally, no wheezes, no rales and no rhonchi  Cardiovascular:  regular rate/rhythm, normal S1/S2, no murmur/rub/gallop, no ankle edema bilaterally, L sided jugular venous distention   Skin:  warm/dry and no rash  Psychiatric:  Appropriate affect and behavior and Normal speech  Neurologic:   Alert and oriented x 3    Data Reviewed:    Labs: Blood work for CBC, CMP, TSH, HIV, CRP, and BNP; PCR test for hepatitis C pending    Course: Condition at discharge: Good     Differential Diagnosis: venous distention vs upper extremity thrombosis vs cardiac abnormality    Assessment:   1. Neck pain    2. Jugular venous distension      Plan:    Orders Placed This Encounter   . CANCELED: US SOFT TISSUE NECK   . CBC/DIFF   . COMPREHENSIVE METABOLIC PANEL, NON-FASTING   . THYROID STIMULATING HORMONE WITH FREE T4 REFLEX   . HIV1/HIV2 SCREEN, COMBINED ANTIGEN AND ANTIBODY   . HEPATITIS C ANTIBODY SCREEN WITH REFLEX TO HCV PCR   . C-REACTIVE PROTEIN(CRP),INFLAMMATION   . B-TYPE NATRIURETIC PEPTIDE   .  CBC WITH DIFF   . TRANSTHORACIC ECHOCARDIOGRAM - ADULT   . PERIPHERAL VENOUS DUPLEX - UPPER     Referrals for an echocardiogram and an ultrasound of the neck placed.  Blood work for CBC, CMP, TSH, HIV, CRP, and BNP pending, will notify with results.  Hepatitis C test by PCR pending, will notify with results.  Advised to follow up in 1 weeks once test results have returned for further evaluation.    Go to Emergency Department immediately for further work up if any concerning symptoms.  Plan was discussed and patient verbalized understanding.  If symptoms are worsening or not improving the patient should return to the [facility] for further evaluation.    I am scribing for, and in the presence of, Dr. Malena Catholic for services provided on 11/10/2018.  Albertha Ghee, Vivian, [place] 11/10/2018, 15:05    I personally performed the services described in this documentation, as scribed  in my presence, and it is both accurate  and complete.    Starlyn Skeans, MD

## 2018-11-10 NOTE — Nursing Note (Signed)
Labs drawn from Pt RT AC ( 1 green 3 lavender ) pt tolerated well , bandage placed and labs sent Davina Poke, [facility]  11/10/2018, 15:31

## 2018-11-14 ENCOUNTER — Other Ambulatory Visit: Payer: Self-pay

## 2018-11-14 ENCOUNTER — Ambulatory Visit
Admission: RE | Admit: 2018-11-14 | Discharge: 2018-11-14 | Disposition: A | Discharge status: Home / Self Care | Payer: 59 | Source: Ambulatory Visit | Attending: Emergency Medicine | Admitting: Emergency Medicine

## 2018-11-14 DIAGNOSIS — M542 Cervicalgia: Secondary | ICD-10-CM | POA: Insufficient documentation

## 2018-11-14 DIAGNOSIS — R0989 Other specified symptoms and signs involving the circulatory and respiratory systems: Secondary | ICD-10-CM | POA: Insufficient documentation

## 2018-11-16 ENCOUNTER — Other Ambulatory Visit (INDEPENDENT_AMBULATORY_CARE_PROVIDER_SITE_OTHER): Payer: Self-pay | Admitting: Emergency Medicine

## 2018-11-16 DIAGNOSIS — R0989 Other specified symptoms and signs involving the circulatory and respiratory systems: Secondary | ICD-10-CM

## 2018-12-25 ENCOUNTER — Other Ambulatory Visit: Payer: Self-pay

## 2018-12-25 ENCOUNTER — Emergency Department
Admission: EM | Admit: 2018-12-25 | Discharge: 2018-12-25 | Disposition: A | Discharge status: Home / Self Care | Payer: 59 | Attending: Physician Assistant | Admitting: Physician Assistant

## 2018-12-25 DIAGNOSIS — W268XXA Contact with other sharp object(s), not elsewhere classified, initial encounter: Secondary | ICD-10-CM | POA: Insufficient documentation

## 2018-12-25 DIAGNOSIS — S61419A Laceration without foreign body of unspecified hand, initial encounter: Secondary | ICD-10-CM

## 2018-12-25 DIAGNOSIS — S61012A Laceration without foreign body of left thumb without damage to nail, initial encounter: Secondary | ICD-10-CM | POA: Insufficient documentation

## 2018-12-25 NOTE — Discharge Instructions (Signed)
Please return immediately for worsening symptoms or new and worrisome symptoms.  Follow up with your primary care physician in 7-10 days for suture removal.

## 2018-12-25 NOTE — ED Nurses Note (Signed)
Pt provided verbal/ written d/c instructions by provider-- verbalized understanding regarding follow up and s/s to return to ED  Pt ambulated independently with steady gait out of dept, no complaints or s/s distress

## 2018-12-26 DIAGNOSIS — S61012A Laceration without foreign body of left thumb without damage to nail, initial encounter: Secondary | ICD-10-CM

## 2018-12-26 NOTE — Procedures (Signed)
Encounter Date: 12/25/2018    Emergency Department Procedure:    Laceration Repair  Procedure performed at: 1200  Description: Simple  Length: 3 cm(cm)  Depth: (3 mm)  Skin was prepped with: ChloraPrep  Local Anesthesia: Lidocaine Plain  Location: (Thumb)  Documentation: Cleaned, and Irrigated  Skin Closure with: Prolene  Attending Physician: (Dr. Barkley Bruns)

## 2018-12-26 NOTE — ED Provider Notes (Signed)
[facility] - EMERGENCY DEPARTMENT  [address] by [address]  [place] [address]  Dept: [phone]    12/25/2018            Patient is a 23 y.o. male presenting to the ED with chief complaint of left thumb laceration.  Patient states he was washing dishes when he cut his thumb on the edge of a ceramic bowl.  Patient states he has full range of motion to his thumb.  Patient states he is up-to-date on tetanus.  Patient states he washed the thumb out with water afterwards.  Patient denies fever, chills, paresthesias, or weakness to the digit.    Past Medical History:   Diagnosis Date   . Asthma      Past Surgical History:   Procedure Laterality Date   . Hx appendectomy       Allergies   Allergen Reactions   . Nkda [No Known Drug Allergies]       Current Outpatient Medications   Medication Sig   . albuterol sulfate (PROAIR HFA) 90 mcg/actuation Inhalation HFA Aerosol Inhaler Take 2 Puffs by inhalation Every 4 hours as needed      Above history reviewed with patient.  Previous records also reviewed.     Review of Systems   Constitutional: Negative for activity change, diaphoresis, fatigue and fever.   HENT: Negative for dental problem, ear pain, sinus pressure, sinus pain, sore throat, trouble swallowing and voice change.    Eyes: Negative for discharge, redness and visual disturbance.   Respiratory: Negative for chest tightness, shortness of breath and wheezing.    Cardiovascular: Negative for chest pain, palpitations and leg swelling.   Gastrointestinal: Negative for abdominal distention, abdominal pain, constipation, diarrhea, nausea, rectal pain and vomiting.   Genitourinary: Negative for decreased urine volume, difficulty urinating, dysuria, flank pain, testicular pain and urgency.   Musculoskeletal: Negative for arthralgias, myalgias, neck pain and neck stiffness.   Skin: Negative for color change and rash.        Laceration   Neurological: Negative for dizziness,  seizures, syncope and headaches.   Psychiatric/Behavioral: Negative for agitation, behavioral problems, confusion and suicidal ideas.   All other systems reviewed and are negative.      All other systems reviewed and are negative, unless commented on in the HPI.     Filed Vitals:    12/25/18 2217   BP: 132/76   Pulse: 90   Resp: 18   Temp: 36.9 C (98.4 F)   SpO2: 98%       Physical Exam   Constitutional: He is oriented to person, place, and time and well-developed, well-nourished, and in no distress.   HENT:   Head: Normocephalic.   Eyes: Pupils are equal, round, and reactive to light. Conjunctivae and EOM are normal. Right eye exhibits no chemosis and no discharge. Left eye exhibits no chemosis and no discharge.   Neck: Normal range of motion. Neck supple. No tracheal deviation present.   Cardiovascular: Normal rate, regular rhythm, normal heart sounds and intact distal pulses. Exam reveals no gallop and no friction rub.   No murmur heard.  Pulmonary/Chest: Effort normal and breath sounds normal. No stridor. No respiratory distress. He has no wheezes. He has no rales. He exhibits no tenderness.   Abdominal: Soft. Bowel sounds are normal. He exhibits no distension and no mass. There is no abdominal tenderness. There is no rebound and no guarding.   Musculoskeletal: Normal range  of motion.         General: No tenderness, deformity or edema.      Comments: Patient has full range of motion to the thumb.  Normal strength.  Sensation intact.  Normal cap refill.   Neurological: He is alert and oriented to person, place, and time. He has normal reflexes. No cranial nerve deficit. He exhibits normal muscle tone. Gait normal. Coordination normal. GCS score is 15.   Skin: Skin is warm and dry. No rash noted. No erythema. No pallor.   Laceration to the left thumb.  About 3 cm in length.  3 mm in depth.  No foreign debris noted   Psychiatric: Mood, memory, affect and judgment normal.          Workup:     No results found for  this or any previous visit (from the past 24 hour(s)).    No orders to display       Orders Placed This Encounter   . XR HAND LEFT         Abnormal Lab results:  Labs Reviewed - No data to display    ED Course:  Appropriate labs and imaging ordered. Medical Records reviewed.   During the patient's stay in the emergency department, the above listed imaging and/or labs were performed to assist with medical decision making and were reviewed by myself when available for review.     Pt remained stable throughout the emergency department course.    Laceration repaired without complications, see note  Advised patient return for signs of infection  Return in 7-10 days for suture removal    Impression:    Encounter Diagnosis   Name Primary?   . Hand laceration Yes      Disposition:  Discharged     Follow Up Plan:  [facility]  [place]  [phone]  Go to   As needed, If symptoms worsen      The co-signing faculty was physically present in [facility] and available for consultation. He/she did not particpate in the care of this patient.      [address] [place], PA-C  12/26/2018, 00:07

## 2019-02-06 ENCOUNTER — Other Ambulatory Visit (HOSPITAL_COMMUNITY): Payer: Self-pay | Admitting: PHYSICIAN ASSISTANT

## 2019-02-06 DIAGNOSIS — I889 Nonspecific lymphadenitis, unspecified: Secondary | ICD-10-CM

## 2019-03-19 ENCOUNTER — Ambulatory Visit (INDEPENDENT_AMBULATORY_CARE_PROVIDER_SITE_OTHER): Payer: 59

## 2019-03-19 ENCOUNTER — Other Ambulatory Visit (INDEPENDENT_AMBULATORY_CARE_PROVIDER_SITE_OTHER): Payer: Self-pay | Admitting: Physician Assistant

## 2019-03-19 ENCOUNTER — Encounter (INDEPENDENT_AMBULATORY_CARE_PROVIDER_SITE_OTHER): Payer: Self-pay

## 2019-03-19 ENCOUNTER — Ambulatory Visit (HOSPITAL_BASED_OUTPATIENT_CLINIC_OR_DEPARTMENT_OTHER): Payer: 59

## 2019-03-19 ENCOUNTER — Encounter (FREE_STANDING_LABORATORY_FACILITY): Payer: 59 | Admitting: Physician Assistant

## 2019-03-19 ENCOUNTER — Encounter (FREE_STANDING_LABORATORY_FACILITY)
Admit: 2019-03-19 | Discharge: 2019-03-19 | Disposition: A | Discharge status: Home / Self Care | Payer: 59 | Attending: Physician Assistant | Admitting: Physician Assistant

## 2019-03-19 ENCOUNTER — Ambulatory Visit
Admission: RE | Admit: 2019-03-19 | Discharge: 2019-03-19 | Disposition: A | Discharge status: Home / Self Care | Payer: 59 | Source: Ambulatory Visit | Attending: Physician Assistant | Admitting: Physician Assistant

## 2019-03-19 ENCOUNTER — Other Ambulatory Visit: Payer: Self-pay

## 2019-03-19 VITALS — BP 122/80 | HR 69 | Temp 97.3°F | Resp 18 | Ht 71.0 in | Wt 185.9 lb

## 2019-03-19 DIAGNOSIS — R1030 Lower abdominal pain, unspecified: Secondary | ICD-10-CM

## 2019-03-19 DIAGNOSIS — N50812 Left testicular pain: Secondary | ICD-10-CM

## 2019-03-19 DIAGNOSIS — R590 Localized enlarged lymph nodes: Secondary | ICD-10-CM

## 2019-03-19 DIAGNOSIS — R9389 Abnormal findings on diagnostic imaging of other specified body structures: Secondary | ICD-10-CM

## 2019-03-19 DIAGNOSIS — N503 Cyst of epididymis: Secondary | ICD-10-CM

## 2019-03-19 DIAGNOSIS — L918 Other hypertrophic disorders of the skin: Secondary | ICD-10-CM

## 2019-03-19 NOTE — Progress Notes (Signed)
[facility]  [address]  [place] [address]  [phone]    Attending physician: McMillion   Provider: Aggie Hacker PA-C      History of Present Illness: Kirk Gonzalez is a 24 y.o. male who presents to the Urgent Care/ [facility] today with chief complaint of    Chief Complaint            Pelvic Pain pelvic/groin pain, left side       .     HPI: left pelvic pain. Went to urgent care one year ago for the same pain had [place] and had small testicular cyst but otherwise no pathology. Pain came back and is now concerned for hernia. Possible testicular discomfort. Recently feeling need to urinate but no urine present unsure if it was stress induced due to school exam or not. Denies fever, chills, nausea , vomiting or other symptoms. No redness, rashes, swelling. Nothing he noticed makes the pain worse. No other lumps or bumps/lymph nodes.   Also mom found abnormal growth on his back he'd like to have checked out.     Review of Systems:    Pulmonary:   no SOB  Cardiovascular:  no chest pain  Genitourinary:  no flank pain    I reviewed and confirmed the patient's past medical history taken by the nurse or medical assistant with the addition of the following:    Past Medical History:    Past Medical History:   Diagnosis Date   . Asthma          Past Surgical History:    Past Surgical History:   Procedure Laterality Date   . HX APPENDECTOMY           Allergies:  Allergies   Allergen Reactions   . Nkda [No Known Drug Allergies]      Medications:    Current Outpatient Medications   Medication Sig   . albuterol sulfate (PROAIR HFA) 90 mcg/actuation Inhalation HFA Aerosol Inhaler Take 2 Puffs by inhalation Every 4 hours as needed     Social History:    Social History     Tobacco Use   . Smoking status: Never Smoker   . Smokeless tobacco: Never Used   Vaping Use   . Vaping Use: Never used   Substance Use Topics   . Alcohol use: Yes     Alcohol/week: 0.0 standard drinks   . Drug use: No     Family  History: No significant family history.  Family Medical History:     Problem Relation (Age of Onset)    Healthy Mother, Father              Physical Exam:  Vital signs:   Vitals:    03/19/19 1159   BP: 122/80   Pulse: 69   Resp: 18   Temp: 36.3 C (97.3 F)   SpO2: 98%   Weight: 84.3 kg (185 lb 14.4 oz)   Height: 1.803 m (5\' 11" )   BMI: 25.98           General:  Well appearing and No acute distress  Pulmonary:  no resp distress  Gastrointestinal:  soft, non-tender and suprapubic palpation reproduces urge to urinate  Genito-urinary:  male, normal penis, no discharge, normal testes without mass and + left testicular tendernss diffusely. no right testicular tenderness. 1 palpable mobile Left inguinal lymph node with tenderness. no significant hernia palpated. no rashes. chaparoned by   Skin: acne to  back with one skin tag     Data Reviewed:      Point-of-care testing:  Time collected: 1231  Leukocytes: Negative  Nitrite: Negative  Urobilinogen: 0.2mg /dL (Normal)  Protein: Negative  pH: 7.0  Blood (urine): Negative  Urine Specific Gravity: 1.015  Ketones: Negative  Bilirubin: Negative  Glucose: Negative                         Course: Condition at discharge: Good     Differential Diagnosis: STI vs lymphadenopathy vs hernia     Assessment:   1. Inguinal pain, unspecified laterality    2. Inguinal lymphadenopathy    3. Testicular pain, left    4. Skin tag        Plan:    Orders Placed This Encounter   . CANCELED: [place] SUPERFICIAL SOFT TISSUE   . US ABDOMINAL WALL (R/O HERNIATION)   . US SCROTUM   . US GUIDED ASP FOR LYMPH NODE BIOPSY   . NEISSERIA GONORRHOEAE DNA BY PCR   . CHLAMYDIA TRACHOMITIS DNA BY PCR (INHOUSE)   . POCT AUTOMATED RACK URINE WITHOUT MICROSCOPY      ultrasound ruled out hernia. Testicular US shows cyst unchanged from prior. Inguinal [place] suggests abnormal lymphadenopathy. Because this has likely been going on for a year now, recommend biopsy rather than 3 month wait and repeat US. Pt is in agreement  and will get biopsy. Front desk called to schedule apt for biopsy but unable to at this time, awaiting insurance authorization. Will likely know by tomorrow morning. Patient will call back tomorrow. Also schedule appointment with student health as follow up.     Go to Emergency Department immediately for further work up if any concerning symptoms.  Plan was discussed and patient verbalized understanding.  If symptoms are worsening or not improving the patient should return to the clinic for further evaluation.    The co-signing faculty was physically present in clinic available for consultation and did not particpate in the care of this patient.      Clinton Sawyer [place], PA-C 03/19/2019, 18:59

## 2019-03-19 NOTE — Patient Instructions (Signed)
Lymphadenopathy  Lymphadenopathy is swelling of the lymph nodes. Lymph nodes are small, bean-shaped glands around the body.  What are lymph nodes?  Lymph nodes are part of your immune system. These glands are found in your neck, over your clavicle, armpits, groin, chest, and belly (abdomen). They act as filters for lymph fluid as it flows through your body. Lymph fluid contains white blood cells (lymphocytes) that help the body fight infection and disease.  Why lymph nodes swell  Lymphadenopathy is very common. The glands often get larger during a viral or bacterial infection. It can happen during a cold, the flu, or strep throat. The nodes may swell in just one area of the body, such as the neck (localized). Or nodes may swell all over the body (generalized). The neck (cervical) lymph nodes are the most common site of lymphadenopathy.  What causes lymphadenopathy?  Dead cells and fluid build up in the lymph nodes as they help fight infection or disease. This causes them to swell in size. Enlarged lymph nodes are often near the source of infection. This can help to find the cause of an infection. For example, swollen lymph nodes around the jaw may be because of an infection in the teeth or mouth. But lymphadenopathy may also be generalized. This is common in some viral illnesses such as infectious mononucleosis, HIV, or chickenpox (varicella).  Lymphadenopathy can also be caused by:   Infection of a lymph node or small group of nodes (lymphadenitis)   Cancer   Reactions to medicines such as antibiotics and certain blood pressure, gout, and seizure medicines   Other health conditions, such as lupus or sarcoidosis  Symptoms of lymphadenopathy  Lymphadenopathy can cause symptoms such as:   Lumps under the jaw, on the sides or back of the neck, in the armpits, in the groin, or in the chest or belly (abdomen)   Pain or soreness in any of these areas   Redness or warmth in any of these areas  You may also have  symptoms from an infection causing the swollen glands. These symptoms may include fever, sore throat, body aches, or cough.  Diagnosing lymphadenopathy  Your healthcare provider will ask about your health history and symptoms. He or she will give you a physical exam and check the areas where lymph nodes are enlarged. Your provider will check the size, texture, and location of the nodes. He or she will ask how long they have been swollen and if they are painful. You may be advised to have diagnostic tests and referral to specialists may be advised. The tests may include:   Blood tests. These are done to check for signs of infection and other problems.   Urine test. This is also done to check for infection and other problems.   Chest X-ray, ultrasound, CT scan, or MRI scans. These tests can show enlarged lymph nodes or other problems.   Lymph node biopsy. The cause of enlarged lymph nodes may be checked with a biopsy. Small samples of lymph node tissue are taken and checked in a lab for signs of infection, cancer, and other causes. You may be referred to other specialists for their opinion as well.  Treatment for lymphadenopathy  The treatment of enlarged lymph nodes depends on the cause. Enlarged lymph nodes are often harmless and go away without any treatment. Treatment is most often done on the cause of the enlarged nodes and may include:   Antibiotic or antiviral medicine to treat a bacterial or   viral infection   Incision and drainage of a lymph node for lymphadenitis   Other medicines or procedures to treat the cause of the enlarged nodes  You may need a follow-up exam in 3 to 4 weeks to recheck enlarged nodes.  When to call your healthcare provider  Call your healthcare provider if:   Your symptoms get worse   You have new symptoms   You have a fever of 100.4F (38C) or higher, or as directed by your provider   Your lymph nodes that are still swollen after 3 to 4 weeks    StayWell last reviewed this  educational content on 06/11/2017   2000-2020 [employer]. All rights reserved. This information is not intended as a substitute for professional medical care. Always follow your healthcare professional's instructions.

## 2019-03-20 ENCOUNTER — Telehealth (INDEPENDENT_AMBULATORY_CARE_PROVIDER_SITE_OTHER): Payer: Self-pay

## 2019-03-20 LAB — NEISSERIA GONORRHOEAE DNA BY PCR: NEISSERIA GONORRHOEAE PCR: NOT DETECTED

## 2019-03-20 LAB — CHLAMYDIA TRACHOMITIS DNA BY PCR (INHOUSE): CHLAMYDIA TRACHOMATIS PCR: NOT DETECTED

## 2019-03-20 NOTE — Nursing Note (Signed)
Patient called in requesting information if his US guided biopsy of testicle had been approved by insurance. Information was sent to Ladora Daniel for approval. Vida Rigger, RT  03/20/2019, 17:35

## 2019-04-11 ENCOUNTER — Other Ambulatory Visit (HOSPITAL_COMMUNITY): Payer: Self-pay

## 2019-04-25 ENCOUNTER — Encounter (HOSPITAL_COMMUNITY): Payer: Self-pay

## 2019-07-03 ENCOUNTER — Ambulatory Visit (INDEPENDENT_AMBULATORY_CARE_PROVIDER_SITE_OTHER): Payer: 59

## 2019-07-03 ENCOUNTER — Other Ambulatory Visit: Payer: Self-pay

## 2019-07-03 DIAGNOSIS — Z111 Encounter for screening for respiratory tuberculosis: Secondary | ICD-10-CM

## 2019-07-03 NOTE — Progress Notes (Cosign Needed)
Kirk Gonzalez presents to [facility] for the placement of a PPD.  See immunization record for placement details.     Patient questions:  Has the patient ever had a tuberculosis screening? Yes   Has the patient ever been exposed to tuberculosis? No   Has the patient ever received the BCG vaccine? No   Is the patient currently taking immune-suppressants? No   Has the patient received a live vaccine within the last 6 weeks? No   Has the patient ever had a positive PPD? No   Is the patient having unintentional weight loss? No   Does the patient have a fever, night sweats, and/or cough? No   Does the patient need a 2-step PPD?  If this is 1st step of a 2-step PPD, HOLD LIVE VACCINES until 2nd step. No       PPD was placed on left forearm.    No LMP for male patient.    The patient was instructed to return in 48-72 hours for the reading of the PPD.    Brown Human, RTR 07/03/2019, 13:55

## 2019-07-03 NOTE — Patient Instructions (Signed)
Tuberculin purified protein derivative, PPD injection   What is this medicine?   TUBERCULIN PURIFIED PROTEIN DERIVATIVE, PPD is a test used to detect if you have a tuberculosis infection. It will not cause tuberculosis infection.   This medicine may be used for other purposes; ask your health care provider or pharmacist if you have questions.   COMMON BRAND NAME(S): Aplisol, Tubersol     What should I tell my health care provider before I take this medicine?   They need to know if you have any of these conditions:   -diabetes   -HIV or AIDS   -immune system problems   -infection (especially a virus infection such as chickenpox, cold sores, or herpes)   -kidney disease   -malnutrition   -an unusual or allergic reaction to tuberculin purified protein derivative, PPD, other medicines, foods, dyes, or preservatives   -pregnant or trying to get pregnant   -breast-feeding     How should I use this medicine?   This medicine is for injection in the skin. It is given by a health care professional in a hospital or clinic setting.   Talk to your pediatrician regarding the use of this medicine in children. While this drug may be prescribed in children, precautions do apply.   Overdosage: If you think you've taken too much of this medicine contact a poison control center or emergency room at once.   Overdosage: If you think you have taken too much of this medicine contact a poison control center or emergency room at once.   NOTE: This medicine is only for you. Do not share this medicine with others.     What if I miss a dose?   It is important not to miss your dose. Call your doctor or health care professional if you are unable to keep an appointment.     What may interact with this medicine?   -adalimumab   -anakinra   -etanercept   -infliximab   -live virus vaccines   -medicines to treat cancer   -steroid medicines like prednisone or cortisone   This list may not describe all possible interactions. Give your health care  provider a list of all the medicines, herbs, non-prescription drugs, or dietary supplements you use. Also tell them if you smoke, drink alcohol, or use illegal drugs. Some items may interact with your medicine.     What should I watch for while using this medicine?   See your health care provider as directed.   This medicine does not protect against tuberculosis.     What side effects may I notice from receiving this medicine?   Side effects that you should report to your doctor or health care professional as soon as possible:   -allergic reactions like skin rash, itching or hives, swelling of the face, lips, or tongue   -breathing problems   Side effects that usually do not require medical attention (Report these to your doctor or health care professional if they continue or are bothersome.):   -bruising   -pain, redness, or irritation at site where injected   This list may not describe all possible side effects. Call your doctor for medical advice about side effects. You may report side effects to FDA at 1-800-FDA-1088.     Where should I keep my medicine?   This drug is given in a hospital or clinic and will not be stored at home.   NOTE: This sheet is a summary. It may not cover all   possible information. If you have questions about this medicine, talk to your doctor, pharmacist, or health care provider.    2013, [employer]vier/[employer]. (02/20/2009 1:24:18 PM)

## 2019-07-05 NOTE — Nursing Note (Signed)
Kirk Gonzalez returns to the [facility] for reading of the PPD.   PPD reading was negative with 0 mm induration, left forearm.   Documentation of PPD results given to patient.   Mirian Capuchin, RN 07/05/2019, 15:19

## 2019-07-25 ENCOUNTER — Other Ambulatory Visit (HOSPITAL_COMMUNITY): Payer: Self-pay

## 2019-09-18 ENCOUNTER — Other Ambulatory Visit (INDEPENDENT_AMBULATORY_CARE_PROVIDER_SITE_OTHER): Payer: Self-pay | Admitting: Physician Assistant

## 2019-09-18 DIAGNOSIS — R590 Localized enlarged lymph nodes: Secondary | ICD-10-CM

## 2019-10-01 ENCOUNTER — Other Ambulatory Visit: Payer: Self-pay

## 2019-10-01 ENCOUNTER — Other Ambulatory Visit (HOSPITAL_COMMUNITY): Payer: Self-pay

## 2019-10-01 ENCOUNTER — Ambulatory Visit (HOSPITAL_BASED_OUTPATIENT_CLINIC_OR_DEPARTMENT_OTHER)
Admission: RE | Admit: 2019-10-01 | Discharge: 2019-10-01 | Disposition: A | Discharge status: Home / Self Care | Payer: 59 | Source: Ambulatory Visit

## 2019-10-01 ENCOUNTER — Ambulatory Visit
Admission: RE | Admit: 2019-10-01 | Discharge: 2019-10-01 | Disposition: A | Discharge status: Home / Self Care | Payer: 59 | Source: Ambulatory Visit | Attending: Physician Assistant | Admitting: Physician Assistant

## 2019-10-01 DIAGNOSIS — R590 Localized enlarged lymph nodes: Secondary | ICD-10-CM | POA: Insufficient documentation

## 2019-10-01 NOTE — H&P (Signed)
VASCULAR & INTERVENTIONAL RADIOLOGY    PRE-PROCEDURE HISTORY & PHYSICAL    Date of Service: 10/01/19.  Chief Complaint: atypical left inguinal lymph nodes.    History of Present Illness  24 y.o. male with chief complaint and diagnosis of atypical left inguinal lymph nodes and left groin pain/discomfort for 1 year duration.  Patient is in need of the following procedure: image guided left inguinal lymph node biopsy.  Chronic Medical Illnesses  Past Medical History:   Diagnosis Date    Asthma          Major Surgical Procedures     Past Surgical History:   Procedure Laterality Date    HX APPENDECTOMY           MEDICATIONS  Albuterol sulfate (PROAIR HFA) 90 mcg/actuation inhalation HFA aerosol inhaler- take 2 puffs by inhalation every 4 hrs as needed.  MEDICATION ALLERGIES  Allergies   Allergen Reactions    Nkda [No Known Drug Allergies]        REVIEW OF SYSTEMS:    Positive for left groin pressure.    Physical Examination  General: resting comfortably in no acute distress  Cardiovascular: Radial pulses 2+ bilaterally  Respiratory: Non-labored breathing.  Abdomen: Abdomen is soft, non-tender, nondistended.  Extremities: No lower extremity edema.  Neurological: A&O x3.    ASSESSMENT & PLAN  Kirk Gonzalez is a 24 y.o. male who presents with atypical inguinal lymph nodes. Will proceed with ultrasound guided left inguinal lymph node biopsy.    SEDATION:  ASA Classification (I-V): I  Mallampati Airway Classifications (I-IV): Marykay Lex, MD 10/01/2019, 13:32  [facility]  Operated by [facility]  [address]  [place] [address]  Dept: [phone]  Dept Fax: [phone]

## 2019-10-01 NOTE — Brief Op Note (Signed)
[facility]  Brief Post-Interventional Radiology Procedure Note    Patient Name:  [facility] Number: [id number]  Date of Birth:  03-11-1995  Date of Service: 10/01/2019     Procedure performed: Ultrasound guided left inguinal lymph node biopsy    Attending: Yvonne Kendall, MD  Assistant: Druscilla Brownie, MD PGY-4    Pre-Operative Diagnosis: Atypical left inguinal lymph nodes  Post-Operative Diagnosis: same    Type of Anesthesia: None  Estimate Blood Loss: minimal  Specimen's removed: Multiple FNA samples and core needle biopsies of a left inguinal lymph node were obtained.    Findings/Interventions/Complications:   No immediate complications.    Recommendations and Follow-up:   - FNA samples were sent for cytopathology and core biopsies were sent for surgical pathology.  - Follow-up per referring provider.    Druscilla Brownie, MD  PGY-4 Diagnostic Radiology

## 2019-10-01 NOTE — Nurses Notes (Signed)
Patient arrived to [facility]. VS per flowsheet.

## 2019-10-02 LAB — FLOW CYTOMETRY, FNA

## 2019-10-03 LAB — SURGICAL PATHOLOGY SPECIMEN

## 2019-10-03 LAB — CYTOPATHOLOGY, FINE NEEDLE ASPIRATE

## 2019-10-26 ENCOUNTER — Ambulatory Visit (INDEPENDENT_AMBULATORY_CARE_PROVIDER_SITE_OTHER): Payer: 59

## 2019-10-26 ENCOUNTER — Encounter (INDEPENDENT_AMBULATORY_CARE_PROVIDER_SITE_OTHER): Payer: Self-pay

## 2019-10-26 ENCOUNTER — Other Ambulatory Visit: Payer: Self-pay

## 2019-10-26 ENCOUNTER — Other Ambulatory Visit (INDEPENDENT_AMBULATORY_CARE_PROVIDER_SITE_OTHER): Payer: Self-pay | Admitting: Emergency Medicine

## 2019-10-26 VITALS — BP 133/81 | HR 68 | Temp 97.5°F | Resp 20 | Ht 72.0 in | Wt 192.9 lb

## 2019-10-26 DIAGNOSIS — B351 Tinea unguium: Secondary | ICD-10-CM

## 2019-10-26 DIAGNOSIS — K219 Gastro-esophageal reflux disease without esophagitis: Secondary | ICD-10-CM

## 2019-10-26 MED ORDER — FAMOTIDINE 20 MG TABLET
20.00 mg | ORAL_TABLET | Freq: Every evening | ORAL | 4 refills | Status: DC
Start: 2019-10-26 — End: 2019-10-26

## 2019-10-26 MED ORDER — TERBINAFINE HCL 250 MG TABLET
250.0000 mg | ORAL_TABLET | Freq: Every day | ORAL | 0 refills | Status: DC
Start: 2019-10-26 — End: 2019-10-26

## 2019-10-26 NOTE — Progress Notes (Signed)
History of Present Illness: Kirk Gonzalez is a 24 y.o. male who [facility] today with chief complaint of    Chief Complaint            Nail Infection/fungus     Acid Reflux         24 y.o. male [facility] with c/o abnormal R 4th toenail onset 2 months ago. He believes he has a nail infection of his R 4th toenail and reports he tried cutting the nail off but it is still growing "weird". Pt sts the nail is thicker than normal. He associates no sxs. Pt denies foot pain, fever, chills, nausea, and vomiting.    Pt sts he also has acid reflux and feels like it is bothering him more recently. He reports the acid reflux started acting up intermittently and describes the pain as a burning pain that is worse when lying down at night or eating red meat. Pt sts he has taken ranitidine in the past 2 years.    I reviewed and confirmed the patient's past medical history taken by the nurse or medical assistant with the addition of the following:    Past Medical History:    Past Medical History:   Diagnosis Date    Asthma      Past Surgical History:    Past Surgical History:   Procedure Laterality Date    HX APPENDECTOMY       Allergies:  Allergies   Allergen Reactions    Nkda [No Known Drug Allergies]      Medications:    Current Outpatient Medications   Medication Sig    albuterol sulfate (PROAIR HFA) 90 mcg/actuation Inhalation HFA Aerosol Inhaler Take 2 Puffs by inhalation Every 4 hours as needed    famotidine (PEPCID) 20 mg Oral Tablet Take 1 Tablet (20 mg total) by mouth Every evening    terbinafine HCL (LAMISIL) 250 mg Oral Tablet Take 1 Tablet (250 mg total) by mouth Once a day for 90 days     Social History:    Social History     Tobacco Use    Smoking status: Never Smoker    Smokeless tobacco: Never Used   Haematologist Use: Never used   Substance Use Topics    Alcohol use: Yes     Alcohol/week: 0.0 standard drinks    Drug use: No     Family  History:  Family Medical History:     Problem Relation (Age of Onset)    Healthy Mother, Father        Review of Systems:    General: no fever and no chills  Gastrointestinal:  no nausea and no vomiting  Neurologic:  no foot pain  Skin:  abnormal R 4th toenail  All other review of systems were negative    Physical Exam:  Vital signs:   Vitals:    10/26/19 1521   BP: 133/81   Pulse: 68   Resp: 20   Temp: 36.4 C (97.5 F)   TempSrc: Thermal Scan   SpO2: 98%   Weight: 87.5 kg (192 lb 14.4 oz)   Height: 1.829 m (6')   BMI: 26.22     Body mass index is 26.16 kg/m. Facility age limit for growth percentiles is 20 years.  No LMP for male patient.    General: Well appearing and No acute distress  Pulmonary: no visible respiratory distress  Skin: Thick R  4th toenail without discoloration.  White scaling noted underneath R 4th toenail.  GI: abdomen soft, NT, ND  Psychiatric: Appropriate affect and behavior and Normal speech  Neurologic: Alert and oriented x 3    Data Reviewed:    Reviewed CMP from 11/10/18 WNL:  AST = 22  ALT = 19    Course: Condition at discharge: Good     Assessment:   1. Onychomycosis    2. Gastroesophageal reflux disease without esophagitis      Plan:    Orders Placed This Encounter    famotidine (PEPCID) 20 mg Oral Tablet    terbinafine HCL (LAMISIL) 250 mg Oral Tablet     - Use Lamisil as prescribed, AST/ALT WNL in 10/2018  - Lifestyle modifications dicussed with GERD, take Pepcid as prescribed  - Follow up in 4-6 weeks, will check liver enzymes at that time    Go to Emergency Department immediately for further work up if any concerning symptoms. Plan was discussed and patient verbalized understanding. If symptoms are worsening or not improving the patient should return to the Urgent Care for further evaluation.    I am scribing for, and in the presence of, Dr. Berdine Addison for services provided on 10/26/2019.  Nicholes Mango, SCRIBE   Nicholes Mango, SCRIBE 10/26/2019, 16:02    I personally performed the  services described in this documentation, as scribed  in my presence, and it is both accurate  and complete.    Berdine Addison, DO

## 2019-10-26 NOTE — Patient Instructions (Signed)
Nail Fungal Infection  A nail fungal infection changes the way fingernails and toenails look. They may thicken, discolor, change shape, or split. This condition is hard to treat because nails grow slowly and have limited blood supply. The infection often comes back after treatment.   There are 2 types of medicines used to treat this condition:    Antifungal medicines for the skin (topical).  These are applied to the skin and nail area. These medicines don't work well because they can't get deep into the nail.   Oral antifungal medicines. These medicines work better because they go into the nail from the inside out. But the infection may still come back. It may take 9 to 12 months for your nail to look normal again. This means you are cured. You can repeat treatment if needed. Most people take these medicines without any problems. It's rare to stop therapy because of side effects. But your healthcare provider may give you some monitoring tests. Talk about possible side effects with your provider before starting treatment.  If medicines fail, the nail can be removed surgically or chemically. These methods physically remove the fungus from the body. This helps medical treatment be more effective.   If the changes in your nails are not bothering you, you may not need treatment. Discuss this with your healthcare provider.   Home care   Use medicines exactly as directed for as long as directed. Treating a fungal infection can take longer than other kinds of infections.   Smoking is a risk factor for fungal infection. This is one more reason to quit.   Wear absorbent socks, and shoes that let your feet breathe. Sweaty feet increase your risk of fungal infection. They also make an existing infection harder to treat.   Use footwear when in damp public places like swimming pools, gyms, and shower rooms. This will help you stay away from the fungus that grows there.   Don't share nail clippers or scissors with  others.    Follow-up care  Follow up with your healthcare provider, or as advised.  When to seek medical advice  Call your healthcare provider right away if any of these occur:   Skin by the nail becomes red, swollen, painful, or drains pus (a creamy yellow or white liquid)   Side effects from oral anti-fungal medicines  StayWell last reviewed this educational content on 07/11/2017   2000-2021 [employer], [place]. All rights reserved. This information is not intended as a substitute for professional medical care. Always follow your healthcare professional's instructions.        Discharge Instructions for Gastroesophageal Reflux Disease (GERD)  Gastroesophageal reflux disease (GERD) is when acid flows back from the stomach into the swallowing tube (esophagus).  Home care  These home care steps can help you handle GERD:   Stay at a healthy weight. Get help to lose any extra pounds (kilograms).   Don't lie down after meals.   Don't eat late at night.   Raise the head of your bed by 4 to 6 inches. You can do this by placing wooden blocks or bed risers under the head of your bed. Or you can put a wedge under the mattress.   Don't wear tight-fitting clothes.   Don't eat foods that might bother your stomach, such as:  ? Alcohol  ? Fat  ? Chocolate  ? Caffeine  ? Spearmint or peppermint  ? Citrus and other acidic foods   Talk with your  healthcare provider if you are taking certain medicines. These can make GERD symptoms worse:  ? Calcium channel blockers  ? Theophylline  ? Anticholinergic medicines, such as oxybutynin and benzatropine   Start an exercise program. Ask your healthcare provider how to get started. Simple activities, such as walking or gardening, can help.   Break the smoking habit. Join a stop-smoking program to improve your chances of success.   Limit alcohol intake to no more than 2 drinks a day.   Take your medicines as directed. Don't skip doses.   Don't take over-the-counter  nonsteroidal anti-inflammatory drugs (NSAIDs), such as aspirin and ibuprofen, unless your healthcare provider advises them for certain health problems.   If possible, don't take nitrates (heart medicines, such as nitroglycerin and isosorbide dinitrate).   Talk with your healthcare provider about treatment if you are pregnant. GERD can happen or get worse during pregnancy.  Follow-up care  Make a check-up visit as directed by our staff.  When to call the healthcare provider  Call your healthcare provider right away if you have:   Trouble swallowing   Pain when swallowing   Feeling of food caught in your chest or throat   Pain in the neck, chest, or back   Heartburn that causes you to vomit   Vomiting blood   Black or tarry stools (from digested blood)   More saliva (watering of the mouth) than usual   Weight loss of more than 3% to 5%of your total body weight in a month   Hoarseness or sore throat that won't go away   Choking, coughing, or wheezing  StayWell last reviewed this educational content on 04/11/2017   2000-2021 [employer], [place]. All rights reserved. This information is not intended as a substitute for professional medical care. Always follow your healthcare professional's instructions.

## 2019-10-26 NOTE — Progress Notes (Unsigned)
Thinks he has a "nail infection: on left foot on 4th digit  Noticed it a couple months ago  Has tried cutting the nail off but still growing "weird"  No foot pain  Pt states the nail is thicker    Pt states he also has acid reflux and feels like it is bothering him more recently.  The acid reflux started acting up intermittently described as a burning that is worse when lying down at night or eating red meat.  Has taken rantiide in the past 2 years.          4th digit with thick white material noted underneath nail

## 2020-01-14 ENCOUNTER — Other Ambulatory Visit: Payer: Self-pay

## 2020-01-14 ENCOUNTER — Encounter (FREE_STANDING_LABORATORY_FACILITY)
Admit: 2020-01-14 | Discharge: 2020-01-14 | Disposition: A | Discharge status: Home / Self Care | Payer: 59 | Attending: Emergency Medicine | Admitting: Emergency Medicine

## 2020-01-14 ENCOUNTER — Ambulatory Visit (INDEPENDENT_AMBULATORY_CARE_PROVIDER_SITE_OTHER): Payer: 59

## 2020-01-14 ENCOUNTER — Encounter (FREE_STANDING_LABORATORY_FACILITY): Payer: 59 | Admitting: Emergency Medicine

## 2020-01-14 DIAGNOSIS — R059 Cough, unspecified: Secondary | ICD-10-CM

## 2020-01-14 DIAGNOSIS — Z20822 Contact with and (suspected) exposure to covid-19: Secondary | ICD-10-CM

## 2020-01-14 NOTE — Progress Notes (Signed)
Medical student with cough.

## 2020-01-15 LAB — COVID-19 ~~LOC~~ MOLECULAR LAB TESTING: SARS-CoV-2: DETECTED — AB

## 2020-02-10 ENCOUNTER — Encounter

## 2020-03-10 ENCOUNTER — Ambulatory Visit (INDEPENDENT_AMBULATORY_CARE_PROVIDER_SITE_OTHER): Payer: 59

## 2020-03-10 ENCOUNTER — Encounter (INDEPENDENT_AMBULATORY_CARE_PROVIDER_SITE_OTHER): Payer: Self-pay

## 2020-03-10 ENCOUNTER — Encounter (INDEPENDENT_AMBULATORY_CARE_PROVIDER_SITE_OTHER): Payer: Self-pay | Admitting: Emergency Medicine

## 2020-03-10 ENCOUNTER — Other Ambulatory Visit: Payer: Self-pay

## 2020-03-10 VITALS — BP 132/75 | HR 72 | Temp 97.1°F | Ht 72.0 in | Wt 191.4 lb

## 2020-03-10 DIAGNOSIS — R197 Diarrhea, unspecified: Secondary | ICD-10-CM

## 2020-03-10 NOTE — Progress Notes (Signed)
History of Present Illness: Kirk Gonzalez is a 25 y.o. male who presents to the [facility] today with chief complaint of    Chief Complaint            Diarrhea Since Wednesday.Able to eat and drink.        [facility] with c/o diarrhea onset 5-6 days ago. He reports having a piece of pork the day prior and [employer] later that day prior to symptoms. Pt states that he is vaccinated against COVID and had COVID in January of 2022. He says he has no known sick contacts. Pt reports at the onset of his symptoms having 7-8 bowel movements but says it has improved to 2-3 bowel movements a day. He describes it as a mixture of loose and watery stool. Pt states that he has not taken any OTC medication. He says he has not had any recent travel. 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 He states that he has abd cramping and bloating that is improved with passing gas. Pt associates fatigue especially in his lower body. He denies any fever, chills, myalgia, back pain, and overt bloody stool or mucous in stool.            Functional Health Screening:   Patient is under 18: No  Have you had a recent unexplained weight loss or gain?: No  Because we are aware of abuse and domestic violence today, we ask all patients: Are you being hurt, hit, or frightened by anyone at your home or in your life?: No  Do you have any basic needs within your home that are not being met? (such as Food, Shelter, Games developer, Transportation): No  Patient is under 18 and therefore has no Advance Directives: No         I reviewed and confirmed the patient's past medical history taken by the nurse or medical assistant with the addition of the following:    Past Medical History:    Past Medical History:   Diagnosis Date    Asthma          Past Surgical History:    Past Surgical History:   Procedure Laterality Date    HX APPENDECTOMY           Allergies:  Allergies   Allergen Reactions    Nkda [No Known Drug  Allergies]      Medications:    Current Outpatient Medications   Medication Sig    albuterol sulfate (PROAIR HFA) 90 mcg/actuation Inhalation HFA Aerosol Inhaler Take 2 Puffs by inhalation Every 4 hours as needed    famotidine (PEPCID) 20 mg Oral Tablet TAKE 1 TABLET (20 MG TOTAL) BY MOUTH EVERY EVENING (Patient not taking: Reported on 03/10/2020)    terbinafine HCL (LAMISIL) 250 mg Oral Tablet TAKE 1 TABLET (250 MG TOTAL) BY MOUTH ONCE A DAY FOR 90 DAYS (Patient not taking: Reported on 03/10/2020)     Social History:    Social History     Tobacco Use    Smoking status: Never Smoker    Smokeless tobacco: Never Used   Brewing technologist Use: Never used   Substance Use Topics    Alcohol use: Yes     Alcohol/week: 0.0 standard drinks    Drug use: No     Family History:  Family Medical History:     Problem Relation (Age of Onset)    Healthy Mother, Father  Review of Systems:    General: fatigue, no fever, no chills, no myalgia  Gastrointestinal: diarrhea, abd cramping, abd bloating, no overt bloody stool  Musculoskeletal: no back pain  All other review of systems were negative    Physical Exam:  Vital signs:   Vitals:    03/10/20 1157   BP: 132/75   Pulse: 72   Temp: 36.2 C (97.1 F)   TempSrc: Thermal Scan   SpO2: 98%   Weight: 86.8 kg (191 lb 6.4 oz)   Height: 1.829 m (6')   BMI: 26.01         Body mass index is 25.96 kg/m. Facility age limit for growth percentiles is 20 years.  No LMP for male patient.    General:  Well appearing and no acute distress  ENT:  Normal EAC's, normal TM's, MMM, normal pharynx/tonsils, normal tongue/uvula and no sinus tenderness to palpation  Neck:  Supple  Pulmonary:  Clear to auscultation bilaterally, no wheezes, no rales and no rhonchi  Cardiovascular:  Regular rate/rhythm, normal S1/S2 and no murmur/rub/gallop  Gastrointestinal:  Non-distended, soft, non-tender and no guarding  Skin:  Warm/dry and no rash  Psychiatric:  Appropriate affect and behavior  Neurologic:    Alert and oriented x 3  Hem/Lymph:  No cervical lymphadenopathy        Data Reviewed:    Labs:  GI panel by [facility], OVA and Parasite screen    Course: Condition at discharge: Good     Differential Diagnosis: bacterial gastroenteritis vs viral gastroenteritis vs post infectious gastroenteritis    Assessment:   1. Diarrhea, unspecified type        Plan:    Orders Placed This Encounter    GI Panel by [facility] ([facility])    OVA AND PARASITE SCREEN     Favor resolving gastroenteritis vs developing post infectious colitis   Pt given instructions to complete GI panel by [facility] and OVA and Parasite screen, will follow up with results.  Educated on symptomatic treatment with OTC medications and remedies.  Continue daily fiber supplement, discussed IBgard and low FODMAP foods.   No evidence of acute abdomen on exam, continue to monitor symptoms, will defer blood work for now, but consider if symptoms persist  Encouraged to reach out via my chart with any questions or return to clinic.    Go to Emergency Department immediately for further work up if any concerning symptoms.  Plan was discussed and patient verbalized understanding. If symptoms are worsening or not improving the patient should return to the [facility] for further evaluation.    I am scribing for, and in the presence of, Dr. Guy Franco for services provided on 03/10/2020.  Ernst Spell, SCRIBE   Ernst Spell, [place] 03/10/2020, 13:01    I personally performed the services described in this documentation, as scribed  in my presence, and it is both accurate  and complete.    Guy Franco, DO

## 2020-03-10 NOTE — Patient Instructions (Addendum)
- Can try daily fiber supplement like Metamucil  - Can try IBgard OTC  - Can try to decrease the high FODMAP foods in your diet.   Diarrhea with Uncertain Cause (Adult)    Diarrhea is when stools are loose and watery. This can be caused by:   Viral infections   Bacterial infections   Food poisoning   Parasites   Irritable bowel syndrome (IBS)   Inflammatory bowel diseases such as ulcerative colitis, Crohn's disease, and celiac disease   Food intolerance, such as to lactose, the sugar found in milk and milk products   Reaction to medicines like antibiotics, laxatives, cancer drugs, and antacids  Along with diarrhea, you may also have:   Abdominal pain and cramping   Nausea and vomiting   Loss of bowel control   Fever and chills   Bloody stools  In some cases, antibiotics may help to treat diarrhea. You may have a stool sample test. This is done to see what is causing your diarrhea, and if antibiotics will help treat it. The results of a stool sample test may take up to 2 days. The healthcare provider may not give you antibiotics until he or she has the stool test results.  Diarrhea can cause dehydration. This is the loss of too much water and other fluids from the body. When this occurs, body fluid must be replaced. This can be done with oral rehydration solutions. Oral rehydration solutions are available at drugstores and grocery stores without a prescription. Sports drinks are not the best choice if you are very dehydrated. They have too much sugar and not enough electrolytes.  Home care  Follow all instructions given by your healthcare provider. Rest at home for the next 24 hours, or until you feel better. Avoid caffeine, tobacco, and alcohol. These can make diarrhea, cramping, and pain worse.  If taking medicines:   Over-the-counter nausea and diarrhea medicines are generally OK unless you experience fever or blood stool. Check with your doctor first in those circumstances.   You may use  acetaminophen or NSAID medicines like ibuprofen or naproxen to reduce pain and fever. Dont use these if you have chronic liver or kidney disease, or ever had a stomach ulcer or gastrointestinalbleeding. Don't use NSAID medicines if you are already taking one for another condition (like arthritis) or are on daily aspirin therapy (such as for heart disease or after a stroke). Talk with your healthcare provider first.   If antibiotics were prescribed, be sure you take them until they are finished. Dont stop taking them even when you feel better. Antibiotics must be taken as a full course.  To prevent the spread of illness:   Remember that washing with soap and water and using alcohol-based sanitizer is the best way to prevent the spread of infection. Dry your hands with a single use towel (like a paper towel).   Clean the toilet after each use.   Wash your hands before eating.   Wash your hands before and after preparing food. Keep in mind that people with diarrhea or vomiting should not prepare food for others.   Wash your hands after using cutting boards, countertops, and knives that have been in contact with raw foods.   Wash and then peel fruits and vegetables.   Keep uncooked meats away from cooked and ready-to-eat foods.   Use a food thermometer when cooking. Cook poultry to at least 165F (74C). Cook [employer] (beef, veal, pork, lamb) to at least  160F (71C). Cook fresh beef, veal, lamb, and pork to at least 145F (63C).   Dont eat raw or undercooked eggs (poached or sunny side up), poultry, meat, or unpasteurized milk and juices.  Food and drinks  The main goal while treating vomiting or diarrhea is to prevent dehydration. This is done by taking small amounts of liquids often.   Keep in mind that liquids are more important than food right now.   Drink only small amounts of liquids at a time.   Dont force yourself to eat, especially if you arehaving cramping, vomiting, or diarrhea. Dont  eat large amounts at a time, even if you are hungry.   If you eat, avoid fatty, greasy, spicy, or fried foods.   Dont eat dairy foods or drink milk if you have diarrhea.These can makediarrhea worse.  During the first 24 hours you can try:   Oral rehydration solutions. Sports drinks may be used if you are not too dehydrated and are otherwise healthy.   Soft drinks without caffeine   Ginger ale   Water (plain or flavored)   Decaf tea or coffee   Clear broth, consomm, or bouillon   Gelatin, popsicles, or frozen fruit juice bars  The second 24 hours, if you are feeling better, you can add:   Hot cereal, plain toast, bread, rolls, or crackers   Plain noodles, rice, mashed potatoes, chicken noodle soup, or rice soup   Unsweetened canned fruit (no pineapple)   Bananas  As you recover:   Limit fat intake to less than 15 grams per day. Dont eat margarine, butter, oils, mayonnaise, sauces, gravies, fried foods, peanut butter, meat, poultry, or fish.   Limit fiber. Dont eat raw or cooked vegetables, fresh fruits except bananas, or bran cereals.   Limit caffeine and chocolate.   Limit dairy.   Dont use spices or seasonings except salt.   Go back to your normal diet over time, as you feel better and your symptoms improve.   If the symptoms come back, go back to a simple diet or clear liquids.  Follow-up care  Follow up with your healthcare provider, or as advised. If a stool sample was taken or cultures were done, call the healthcare provider for the results as instructed.  Call 911  Call 911 if you have any of these symptoms:   Trouble breathing   Confusion   Extreme drowsiness or trouble walking   Loss of consciousness   Rapid heart rate   Chest pain   Stiff neck   Seizure  When to seek medical advice  Call your healthcare provider right away if any of these occur:   Abdominal pain that gets worse   Constant lower right abdominal pain   Continued vomiting and inability to keep liquids  down   Diarrhea more than 5 times a day   Blood in vomit or stool   Dark urine or no urine for 8 hours, dry mouth and tongue, tiredness, weakness, or dizziness   Drowsiness   New rash   You dont get better in 2 to 3 days   Fever of 100.94F (38C) or higher, or as directed by your healthcare provider  [employer] last reviewed this educational content on 06/11/2016   2000-2021 [employer], [place]. All rights reserved. This information is not intended as a substitute for professional medical care. Always follow your healthcare professional's instructions.

## 2020-03-10 NOTE — Progress Notes (Deleted)
Diarrhea for 5-6 days  Describes a mixed of loose and watery stools  Pt had a piece of pork the day prior and had chick fil a later that day prior to symptoms  Admits to abdominal cramping and bloating, passing gas helps with symptoms  No fever or chills or bodyaches  Admits to fatigue especially with lower body   No sick contacts   Vaccinated against covid and had covid in Jan 2022  Has not taking OTC medications  Initially had bowel movements 7-8 times and then this weekend has improved.  More loose compared to normal and now have bowel movements 2-3 times a day.  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   Denies any overt bloody stool  No recent travel

## 2020-04-02 ENCOUNTER — Other Ambulatory Visit: Payer: Self-pay

## 2020-04-02 ENCOUNTER — Encounter (INDEPENDENT_AMBULATORY_CARE_PROVIDER_SITE_OTHER): Payer: Self-pay | Admitting: Student in an Organized Health Care Education/Training Program

## 2020-04-02 ENCOUNTER — Ambulatory Visit (INDEPENDENT_AMBULATORY_CARE_PROVIDER_SITE_OTHER): Payer: 59 | Admitting: Student in an Organized Health Care Education/Training Program

## 2020-04-02 VITALS — BP 124/76 | HR 72 | Temp 97.9°F | Ht 71.0 in | Wt 197.8 lb

## 2020-04-02 DIAGNOSIS — F4320 Adjustment disorder, unspecified: Secondary | ICD-10-CM

## 2020-04-02 DIAGNOSIS — Z0000 Encounter for general adult medical examination without abnormal findings: Secondary | ICD-10-CM

## 2020-04-02 MED ORDER — FLUOXETINE 10 MG CAPSULE
10.0000 mg | ORAL_CAPSULE | Freq: Every day | ORAL | 1 refills | Status: DC
Start: 2020-04-02 — End: 2020-05-08
  Filled 2020-04-02: qty 30, 30d supply, fill #0
  Filled 2020-04-30: qty 30, 30d supply, fill #1

## 2020-04-02 NOTE — Nursing Note (Signed)
04/02/20 1400   Gad-7 (Over the last 2 weeks, how often have you been bothered by the following problems?)   Feeling nervous,anxious,on edge 2   Not being able to stop or control worrying 2   Worrying too much about different things 0   Trouble relaxing 0   Being so restless that it is hard to sit still 0   Becoming easily annoyed or irritable 0   Feeling afraid as if something awful might happen 0   How difficult have these problems made it for you to work, take care of things at home, or get along with other people? Somewhat difficult   Gad-7 Score   Gad-7 Score Total 4   Interpretation 0-4, normal

## 2020-04-02 NOTE — Progress Notes (Signed)
[facility]  FAMILY MEDICINE, [facility]  [address]  [place] [address]  [phone]            Name: Kirk Gonzalez  MRN : [id number]  Date of service: 04/02/2020    Chief Complaint  New Patient, Establish Care, and Anxiety      History of Present Illness:    25 year old male who presents to establish care with new physician.  Patient presents with mood symptoms that have been going once daily for a little over a year now.  Complaining of anxiety like symptoms that are progressively getting worse.  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  Many times feels that he cannot recall information secondary to feelings of anxiousness.  Patient has never been on medications in the past.  Never struggled with mood disorders in the past.  Denies family personal history thyroid disease.  Denies SI or HI      Past Medical History:   Diagnosis Date   . Asthma            Past Surgical History:   Procedure Laterality Date   . HX APPENDECTOMY             Family Medical History:     Problem Relation (Age of Onset)    Back Problems Mother    Celiac Disease Brother    Diabetes Brother    Diabetes type I Brother    Diabetes type II Paternal Grandfather    Hypertension (High Blood Pressure) Father    Osteopenia Mother            Social History     Socioeconomic History   . Marital status: Single   . Number of children: 0   Occupational History   . Occupation: Consulting civil engineer   Tobacco Use   . Smoking status: Never Smoker   . Smokeless tobacco: Never Used   Vaping Use   . Vaping Use: Never used   Substance and Sexual Activity   . Alcohol use: Yes     Alcohol/week: 0.0 standard drinks     Comment: social   . Drug use: No       Current Outpatient Medications   Medication Sig   . albuterol sulfate (PROAIR HFA) 90 mcg/actuation Inhalation HFA Aerosol Inhaler Take 2 Puffs by inhalation Every 4 hours as needed   . FLUoxetine (PROZAC) 10 mg Oral Capsule Take 1 Capsule (10 mg total) by mouth  Once a day for 30 days   . psyllium husk (METAMUCIL ORAL) Take by mouth         REVIEW OF SYSTEMS:  Constitutional:  (-) fevers (-) chills. (-) weight loss. (-) fatigue.  HEENT.   (-) congestion (-) eye pain. (-) sore throat.   Heart: (-) chest pain. (-) palpitation.  Lungs: (-) dyspnea (on exertion)(-) cough.   GI: (-) poor appetite. (-) abdominal pain. (-) nausea (-) vomiting.   (-) diarrhea. (-) constipation.   Psychiatric: (-) Depression. (+) anxiety. (-) insomnia.   Neurologic: (-) headaches. (-) neuropathy. (-) weakness. (-) memory problems.  All systems reviewed and negative unless indicated above or in HPI.     Physical Exam:  BP 124/76   Pulse 72   Temp 36.6 C (97.9 F) (Thermal Scan)   Ht 1.803 m (5\' 11" )   Wt 89.7 kg (197 lb 12 oz)   SpO2 98%   BMI 27.58 kg/m   CONSTITUTIONAL:  Alert, cooperative, no distress, appears stated  age. Patient seen on exam table answering questions appropriately.  ENT: EOMI, conjunctivae clear.  no nasal discharge, non-erythematous; lips, mucosa and tongue normal  LYMPH:  trachea midline  MSK: symmetrical, ROM normal throughout. Normocephalic atraumatic. Moving extremities symmetrically with intact strength.   LUNGS: b/l CTA, no wheezes, no rhonchi, no crackles  CV: s1 and s2 present, regular rate and rhythm, no murmurs. No lower extremity edema  GI:  soft, non-tender, bowel sounds present, no masses, no abdominal bruits  NEURO: grossly normal  SKIN: no rashes or lesions  PSYCH: Normal affect and mood, No SI, No HI    Results:  No visits with results within 1 Day(s) from this visit.   Latest known visit with results is:   Clinical Support on 01/14/2020   Component Date Value Ref Range Status   . SARS-CoV-2 01/14/2020 Detected (A) Not Detected Final    Comment: Positive results are indicative of the presence of SARS-CoV-2 RNA; clinical correlation with patient history and other diagnostic information is necessary to determine patient infection status. Positive results do  not rule out bacterial infection or co-infection with other viruses. Laboratories within the [place] are required to report all positive results to the appropriate public health authorities.    Results are for the qualitative identification of SARS-CoV-2 (formerly 2019-nCoV) RNA. The SARS-CoV-2 RNA is generally detectable in nasopharyngeal and oropharyngeal swabs during the ACUTE PHASE of infection. Hence, this test is intended to be performed on respiratory specimens collected from individuals who meet Centers for Disease Control and Prevention (CDC) clinical and/or epidemiological criteria for Coronavirus Disease 2019 (COVID-19) testing. CDC COVID-19 criteria for testing on human specimens are available at [facility] webpage information for Healthcare Professionals:                            Coronavirus Disease 2019 (COVID-19) (K[employer].com.au).     Test methodology:   Cobas 6800 SARS-CoV-2 real-time reverse transcription polymerase chain reaction (rRT-PCR) test on the Roche cobas 6800 System.     Disclaimer:  This assay has been authorized by FDA under an Emergency Use Authorization for use in laboratories certified under the Clinical Laboratory Improvement Amendments of 1988 (CLIA), 42 U.S.C. [phone], to perform high complexity tests. The impacts of vaccines, antiviral therapeutics, antibiotics, chemotherapeutic or immunosuppressant drugs have not been evaluated.    Fact Sheet for Providers: GemMerchandise.ch    Fact Sheet for Patients: SeatDesigner.at         POCT/Meds Administered:   N/A      Screening/Health Maintenance:   No results found for: CHOLESTEROL, HDLCHOL, LDLCHOL, TRIG, GLUCOSEFAST, HA1C   Health Maintenance: Pending and Last Completed       Date Due Completion Date    Adult Tdap-Td (6 - Td or Tdap) 05/12/2018 05/11/2008    Depression Screening 04/02/2021 04/02/2020        Immunization History   Administered  Date(s) Administered   . Covid-19 Gildardo Pounds 02/09/2019, 03/09/2019, 11/23/2019   . PPD(ADMIN) 07/03/2019   . VARICELLA VACCINE LIVE(ADMIN) 02/10/2015       Assessment:  ENCOUNTER DIAGNOSES     ICD-10-CM   1. Encounter for medical examination to establish care  Z00.00   2. Adjustment disorder, unspecified type  F43.20         Plan:   25 year old male who presents to establish care with new physician.  Patient presents with adjustment disorder of anxious type.  Will try the patient on low-dose Prozac 10  mg and follow up closely in 4 weeks.  Can consider Zoloft as another option or BuSpar as well.  Patient may benefit from beta-blocker to help with feelings of anxiousness with presentations.  Offered blood work today, patient declined.  Discussed the of worsening side effects for severe side effects that he has to go the ER immediately and reach out to our clinic.     The patient was given ample opportunity to ask questions and those questions were answered to the patient's satisfaction. The patient was encouraged to be involved in their own care, and all diagnoses, medications, and medication side-effects were discussed. The patient was told to contact me with any additional questions or concerns, and to go to the emergency department in an emergency.       Education  Medication safety, disease management and lifestyle management    Follow up:  Return in about 4 weeks (around 04/30/2020), or if symptoms worsen or fail to improve, for 4 weeks follow up mood .      Reita Cliche, DO  FAMILY MEDICINE, [facility]  Operated by [facility]  [address]  [place] [address]  Dept: [phone]  Dept Fax: [phone]

## 2020-04-02 NOTE — Nursing Note (Signed)
04/02/20 1400   PHQ 9 (follow up)   Little interest or pleasure in doing things. 0   Feeling down, depressed, or hopeless 0   Trouble falling or staying asleep, or sleeping too much. 0   Feeling tired or having little energy 0   Poor appetite or overeating 0   Feeling bad about yourself/ that you are a failure in the past 2 weeks? 1   Trouble concentrating on things in the past 2 weeks? 0   Moving/Speaking slowly or being fidgety or restless  in the past 2 weeks? 0   Thoughts that you would be better off DEAD, or of hurting yourself in some way. 0   If you checked off any problems, how difficult have these problems made it for you to do your work, take care of things at home, or get along with other people? Not difficult at all   PHQ 9 Total 1   Interpretation of Total Score No depression

## 2020-04-30 ENCOUNTER — Other Ambulatory Visit: Payer: Self-pay

## 2020-04-30 DIAGNOSIS — F4320 Adjustment disorder, unspecified: Secondary | ICD-10-CM

## 2020-05-08 ENCOUNTER — Other Ambulatory Visit: Payer: Self-pay

## 2020-05-08 ENCOUNTER — Ambulatory Visit (INDEPENDENT_AMBULATORY_CARE_PROVIDER_SITE_OTHER): Payer: 59 | Admitting: Student in an Organized Health Care Education/Training Program

## 2020-05-08 ENCOUNTER — Encounter (INDEPENDENT_AMBULATORY_CARE_PROVIDER_SITE_OTHER): Payer: Self-pay | Admitting: Student in an Organized Health Care Education/Training Program

## 2020-05-08 VITALS — BP 116/72 | HR 56 | Temp 97.6°F | Ht 71.0 in | Wt 199.3 lb

## 2020-05-08 DIAGNOSIS — L74510 Primary focal hyperhidrosis, axilla: Secondary | ICD-10-CM

## 2020-05-08 DIAGNOSIS — F4320 Adjustment disorder, unspecified: Secondary | ICD-10-CM

## 2020-05-08 MED ORDER — FLUOXETINE 10 MG CAPSULE
10.0000 mg | ORAL_CAPSULE | Freq: Every day | ORAL | 1 refills | Status: DC
Start: 2020-05-08 — End: 2022-02-18
  Filled 2020-05-08 – 2020-05-31 (×2): qty 90, 90d supply, fill #0

## 2020-05-08 MED ORDER — ALUMINUM CHLORIDE 20 % TOPICAL SOLUTION
1.0000 | Freq: Every evening | CUTANEOUS | 1 refills | Status: AC
Start: 2020-05-08 — End: 2020-08-06
  Filled 2020-05-08: qty 60, 30d supply, fill #0

## 2020-05-08 NOTE — Nursing Note (Signed)
05/08/20 1500   PHQ 9 (follow up)   Little interest or pleasure in doing things. 1   Feeling down, depressed, or hopeless 0   Trouble falling or staying asleep, or sleeping too much. 1   Feeling tired or having little energy 1   Poor appetite or overeating 0   Feeling bad about yourself/ that you are a failure in the past 2 weeks? 0   Trouble concentrating on things in the past 2 weeks? 0   Moving/Speaking slowly or being fidgety or restless  in the past 2 weeks? 0   Thoughts that you would be better off DEAD, or of hurting yourself in some way. 0   If you checked off any problems, how difficult have these problems made it for you to do your work, take care of things at home, or get along with other people? Not difficult at all   PHQ 9 Total 3   Interpretation of Total Score No depression

## 2020-05-08 NOTE — Nursing Note (Signed)
05/08/20 1500   Gad-7 (Over the last 2 weeks, how often have you been bothered by the following problems?)   Feeling nervous,anxious,on edge 0   Not being able to stop or control worrying 0   Worrying too much about different things 0   Trouble relaxing 0   Being so restless that it is hard to sit still 0   Becoming easily annoyed or irritable 0   Feeling afraid as if something awful might happen 0   How difficult have these problems made it for you to work, take care of things at home, or get along with other people? Not difficult at all   Gad-7 Score   Gad-7 Score Total 0   Interpretation 0-4, normal

## 2020-05-08 NOTE — Progress Notes (Unsigned)
[facility]  FAMILY MEDICINE, L[facility]  [address]  [place] [address]  [phone]          NAME: Kirk Gonzalez  DOB:  09/10/1995  AGE:  25 y.o.  MRN:  [id number]    APPT:  05/08/2020  3:15 PM EDT    Chief Complaint: Medication Check F/U (Prozac/)    History of Present Illness:  Kirk Gonzalez is a 25 y.o. male who presents as an established patient to the Family Medicine clinic at [facility]. Presents for follow up for mood. Doing better on medication, no side effects reports of Prozac. Complains of long standing hyperhidrosis.      PHQ Questionnaire  Little interest or pleasure in doing things.: Several Days  Feeling down, depressed, or hopeless: Not at all  PHQ 2 Total: 1  Trouble falling or staying asleep, or sleeping too much.: Several Days  Feeling tired or having little energy: Several Days  Poor appetite or overeating: Not at all  Feeling bad about yourself/ that you are a failure in the past 2 weeks?: Not at all  Trouble concentrating on things in the past 2 weeks?: Not at all  Moving/Speaking slowly or being fidgety or restless  in the past 2 weeks?: Not at all  Thoughts that you would be better off DEAD, or of hurting yourself in some way.: Not at all  PHQ 9 Total: 3  Interpretation of Total Score: 0-4 No depression    GAD-7 Questionnaire 04/02/2020 05/08/2020   Feeling nervous,anxious,on edge 2 0   Not being able to stop or control worrying 2 0   Worrying too much about different things 0 0   Trouble relaxing 0 0   Being so restless that it is hard to sit still 0 0   Becoming easily annoyed or irritable 0 0   Feeling afraid as if something awful might happen 0 0   How difficult have these problems made it for you to work, take care of things at home, or get along with other people? Somewhat difficult Not difficult at all   Gad-7 Score Total 4 0   Interpretation 0-4, normal 0-4, normal       Past Medical History:  Past Medical History:   Diagnosis Date   . Asthma           Past Surgical History:   Procedure Laterality Date   . HX APPENDECTOMY       Family Medical History:     Problem Relation (Age of Onset)    Back Problems Mother    Celiac Disease Brother    Diabetes Brother    Diabetes type I Brother    Diabetes type II Paternal Grandfather    Hypertension (High Blood Pressure) Father    Osteopenia Mother          Social History     Socioeconomic History   . Marital status: Single     Spouse name: Not on file   . Number of children: 0   . Years of education: Not on file   . Highest education level: Not on file   Occupational History   . Occupation: Consulting civil engineer   Tobacco Use   . Smoking status: Never Smoker   . Smokeless tobacco: Never Used   Vaping Use   . Vaping Use: Never used   Substance and Sexual Activity   . Alcohol use: Yes     Alcohol/week: 0.0 standard drinks     Comment:  social   . Drug use: No   . Sexual activity: Not on file   Other Topics Concern   . Not on file   Social History Narrative   . Not on file     Social Determinants of Health     Financial Resource Strain: Not on file   Food Insecurity: Not on file   Transportation Needs: Not on file   Physical Activity: Not on file   Stress: Not on file   Intimate Partner Violence: Not on file   Housing Stability: Not on file     Allergies   Allergen Reactions   . Nkda [No Known Drug Allergies]          Review of Systems:  Constitutional: (-) pain. (-) fevers (-) chills. (-) weight loss. (-) fatigue. (+) hyperhidrosis   HEENT.  (-) congestion. (-) sore throat.   Cardio: (-) chest pain. (-) palpitation.  Pulm: (-) dyspnea (on exertion) (-) hemoptysis. (-) cough.   GI: (-) poor appetite. (-) abdominal pain. (-) nausea (-) vomiting.   (-) diarrhea. (-) constipation.   Psychiatric: (+) Depression. (+) anxiety. (-) insomnia.   Neurologic: (-) headaches. (-) neuropathy. (-) weakness. (-) memory problems.  other than HPI and above, all other systems reviewed and negative.        Physical Examination:  BP 116/72   Pulse 56    Temp 36.4 C (97.6 F) (Thermal Scan)   Ht 1.803 m (5\' 11" )   Wt 90.4 kg (199 lb 4.7 oz)   SpO2 98%   BMI 27.80 kg/m   Constitutional: AF. VSS. AAOx3. NAD.   HEENT: PERRLA/EOMI/ No throat erythema or drainage/MMM.  Trachea midline. Thyroid not enlarged.   Lungs: Nonlabored with symmetric movement. CTAB/No WRR.  Cardio: RRR/S1S2. No murmurs.  GI/GU: S/NT/ND/BS+  Neuro: CN II-XII grossly intact, normal gait.  Psychiatric: Normal mood and affect. No SI/HI  Skin: no rashes or lesions noted on exam.   MSK: normocephalic, atraumatic. ROM intact. Extremities warm and dry. Intact strength and sensation.   Lymph: no edema bilaterally, no lymphadenopathy.     Results:  Clinical Support on 01/14/2020   Component Date Value Ref Range Status   . SARS-CoV-2 01/14/2020 Detected (A) Not Detected Final    Comment: Positive results are indicative of the presence of SARS-CoV-2 RNA; clinical correlation with patient history and other diagnostic information is necessary to determine patient infection status. Positive results do not rule out bacterial infection or co-infection with other viruses. Laboratories within the 03/13/2020 are required to report all positive results to the appropriate public health authorities.    Results are for the qualitative identification of SARS-CoV-2 (formerly 2019-nCoV) RNA. The SARS-CoV-2 RNA is generally detectable in nasopharyngeal and oropharyngeal swabs during the ACUTE PHASE of infection. Hence, this test is intended to be performed on respiratory specimens collected from individuals who meet Centers for Disease Control and Prevention (CDC) clinical and/or epidemiological criteria for Coronavirus Disease 2019 (COVID-19) testing. CDC COVID-19 criteria for testing on human specimens are available at [facility] webpage information for Healthcare Professionals:                            Coronavirus Disease 2019 (COVID-19) ([facility]).     Test  methodology:   Cobas 6800 SARS-CoV-2 real-time reverse transcription polymerase chain reaction (rRT-PCR) test on the Roche cobas 6800 System.     Disclaimer:  This assay has been authorized by FDA under  an Emergency Use Authorization for use in laboratories certified under the Clinical Laboratory Improvement Amendments of 1988 (CLIA), 42 U.S.C. [phone], to perform high complexity tests. The impacts of vaccines, antiviral therapeutics, antibiotics, chemotherapeutic or immunosuppressant drugs have not been evaluated.    Fact Sheet for Providers: GemMerchandise.ch    Fact Sheet for Patients: SeatDesigner.at           Data Reviewed / POCT Results/ Meds Administered:  N/A      Screening/Health Maintenance:   No results found for: CHOLESTEROL, HDLCHOL, LDLCHOL, TRIG, GLUCOSEFAST, HA1C   Health Maintenance: Pending and Last Completed       Date Due Completion Date    Adult Tdap-Td (6 - Td or Tdap) 05/12/2018 05/11/2008    Depression Screening 05/08/2021 05/08/2020        Immunization History   Administered Date(s) Administered   . Covid-19 Gildardo Pounds 02/09/2019, 03/09/2019, 11/23/2019   . PPD(ADMIN) 07/03/2019   . VARICELLA VACCINE LIVE(ADMIN) 02/10/2015       Assessment:  ENCOUNTER DIAGNOSES     ICD-10-CM   1. 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 Adjustment disorder, unspecified type  - FLUoxetine (PROZAC) 10 mg Oral Capsule; Take 1 Capsule (10 mg total) by mouth Once a day  Dispense: 90 Capsule; Refill: 1    2. Hyperhidrosis of axilla  - aluminum chloride (DRYSOL) 20 % Solution; Apply 1 Applicatorful topically Every night for 90 days  Dispense: 60 mL; Refill: 1    If anything comes up in the mean time, I advised the patient to let me know and I would be more than happy to speak with them. If it cannot wait to please go to Urgent Care or ER. The patient was given the opportunity to ask questions and those  questions were answered to the patient's satisfaction. The patient was encouraged to call with any additional questions or concerns. Instructed patient to call back if symptoms worse.     Education  Medication safety, disease management and lifestyle management  The patient has been educated and verbalized understanding regarding the services provided during this visit.    Follow up:  Return in about 3 months (around 08/07/2020), or if symptoms worsen or fail to improve, for hyperhidrosis / mood .    Reita Cliche, DO 05/14/2020, 21:13  FAMILY MEDICINE, [facility]  Operated by [facility]  [address]  [place] [address]  Dept: [phone]  Dept Fax: [phone]

## 2020-05-09 ENCOUNTER — Other Ambulatory Visit: Payer: Self-pay

## 2020-05-12 ENCOUNTER — Encounter (INDEPENDENT_AMBULATORY_CARE_PROVIDER_SITE_OTHER): Payer: Self-pay | Admitting: Student in an Organized Health Care Education/Training Program

## 2020-05-30 ENCOUNTER — Encounter (FREE_STANDING_LABORATORY_FACILITY)
Admit: 2020-05-30 | Discharge: 2020-05-30 | Disposition: A | Discharge status: Home / Self Care | Payer: 59 | Attending: Emergency Medicine | Admitting: Emergency Medicine

## 2020-05-30 ENCOUNTER — Other Ambulatory Visit: Payer: Self-pay

## 2020-05-30 ENCOUNTER — Ambulatory Visit (INDEPENDENT_AMBULATORY_CARE_PROVIDER_SITE_OTHER): Payer: 59

## 2020-05-30 ENCOUNTER — Encounter (INDEPENDENT_AMBULATORY_CARE_PROVIDER_SITE_OTHER): Payer: Self-pay

## 2020-05-30 ENCOUNTER — Encounter (FREE_STANDING_LABORATORY_FACILITY): Payer: 59 | Admitting: Emergency Medicine

## 2020-05-30 VITALS — BP 124/77 | HR 56 | Temp 97.4°F | Resp 18 | Ht 72.0 in | Wt 195.5 lb

## 2020-05-30 DIAGNOSIS — Z111 Encounter for screening for respiratory tuberculosis: Secondary | ICD-10-CM

## 2020-05-30 NOTE — Nursing Note (Signed)
Drew labs from on right [facility] x1 attempt. Patient tolerated well covered site with gauze and band aid. Quantiferon kit drawn.  Arneta Cliche, RN

## 2020-05-30 NOTE — Progress Notes (Signed)
History of Present Illness: Kirk Gonzalez is a 25 y.o. male who presents to the Student [facility]/Urgent Care today with chief complaint of    Chief Complaint            Other Wants quantiferon        Pt presents to Student [facility] requesting Quantiferon test. He states that he has no known exposure to tuberculosis and has not received the BCG vaccine. Pt denies any cough, hemoptysis, fever, swollen lymph nodes, and unexplained weight loss.          On average, how many days/week do you engage in moderate to vigorous physical activity (like brisk walking)?: 6 Days  On average, how many minutes do you enage in physical activity at this level?: 45-60 Minutes/day  Total activity days/week x minutes/day =: 360      Functional Health Screening:   Patient is under 18: No  Have you had a recent unexplained weight loss or gain?: No  Because we are aware of abuse and domestic violence today, we ask all patients: Are you being hurt, hit, or frightened by anyone at your home or in your life?: No  Do you have any basic needs within your home that are not being met? (such as Food, Shelter, Games developer, Transportation): No  Patient is under 18 and therefore has no Advance Directives: No         I reviewed and confirmed the patient's past medical history taken by the nurse or medical assistant with the addition of the following:    Past Medical History:    Past Medical History:   Diagnosis Date   . Asthma      Past Surgical History:    Past Surgical History:   Procedure Laterality Date   . Hx appendectomy       Allergies:  Allergies   Allergen Reactions   . Nkda [No Known Drug Allergies]      Medications:    Current Outpatient Medications   Medication Sig   . albuterol sulfate (PROAIR HFA) 90 mcg/actuation Inhalation HFA Aerosol Inhaler Take 2 Puffs by inhalation Every 4 hours as needed   . aluminum chloride (DRYSOL) 20 % Solution Apply 1 Applicatorful topically Every night for 90 days   . FLUoxetine (PROZAC) 10 mg Oral Capsule Take 1  Capsule (10 mg total) by mouth Once a day   . MULTI-VITAMIN ORAL Take by mouth   . psyllium husk (METAMUCIL ORAL) Take by mouth     Social History:    Social History     Tobacco Use   . Smoking status: Never Smoker   . Smokeless tobacco: Never Used   Vaping Use   . Vaping Use: Never used   Substance Use Topics   . Alcohol use: Yes     Alcohol/week: 0.0 standard drinks     Comment: social   . Drug use: No     Family History:  Family Medical History:     Problem Relation (Age of Onset)    Back Problems Mother    Celiac Disease Brother    Diabetes Brother    Diabetes type I Brother    Diabetes type II Paternal Grandfather    Hypertension (High Blood Pressure) Father    Osteopenia Mother          Review of Systems:    General: no fever, no unexplained weight loss  ENT: no congestion  Pulmonary: no cough, no hemoptysis  Heme/Lymph: no swollen lymph nodes  All other review of systems were negative    Physical Exam:  Vital signs:   Vitals:    05/30/20 1511   BP: 124/77   Pulse: 56   Resp: 18   Temp: 36.3 C (97.4 F)   TempSrc: Tympanic   SpO2: 97%   Weight: 88.7 kg (195 lb 8 oz)   Height: 1.829 m (6')   BMI: 26.57         Body mass index is 26.51 kg/m. Facility age limit for growth percentiles is 20 years.  No LMP for male patient.    General:  Well appearing and no acute distress  ENT:  Normal EAC's, normal TM's, MMM, normal pharynx/tonsils, normal tongue/uvula and no sinus tenderness to palpation  Neck:  Supple  Pulmonary:  Clear to auscultation bilaterally, no wheezes, no rales and no rhonchi  Cardiovascular:  Regular rate/rhythm, normal S1/S2 and no murmur/rub/gallop  Gastrointestinal:  Non-distended, soft, non-tender and no guarding  Skin:  Warm/dry and no rash  Psychiatric:  Appropriate affect and behavior  Neurologic:   Alert and oriented x 3  Hem/Lymph:  No cervical lymphadenopathy        Data Reviewed:    Labs: Quantiferon TB Gold Plus     Course: Condition at discharge: Good     Differential Diagnosis:  Quantiferon testing    Assessment:   1. Screening-pulmonary TB        Plan:    Orders Placed This Encounter   . QUANTIFERON TB GOLD PLUS, BLOOD     Quantiferon TB Gold Plus sent to lab, will follow up with results.     Go to Emergency Department immediately for further work up if any concerning symptoms.  Plan was discussed and patient verbalized understanding. If symptoms are worsening or not improving the patient should return to the Student Health/Urgent Care for further evaluation.    I am scribing for, and in the presence of, Dr. Guy Franco for services provided on 05/30/2020.  Ernst Spell, [place], [place] 05/30/2020, 15:22    I personally performed the services described in this documentation, as scribed  in my presence, and it is both accurate  and complete.    Guy Franco, DO

## 2020-05-31 ENCOUNTER — Other Ambulatory Visit: Payer: Self-pay

## 2020-05-31 DIAGNOSIS — F4320 Adjustment disorder, unspecified: Secondary | ICD-10-CM

## 2020-06-07 LAB — QUANTIFERON TB GOLD PLUS, BLOOD
MITOGEN MINUS NIL RESULT: >10 IU/mL
NIL RESULT: 0.03 IU/mL
QUANTIFERON, QUALITATIVE: NEGATIVE
TB1 AG MINUS NIL RESULT: 0.13 IU/mL
TB2 AG MINUS NIL RESULT: 0.07 IU/mL

## 2020-08-07 ENCOUNTER — Encounter (INDEPENDENT_AMBULATORY_CARE_PROVIDER_SITE_OTHER): Payer: Self-pay | Admitting: Student in an Organized Health Care Education/Training Program

## 2021-06-17 ENCOUNTER — Other Ambulatory Visit (INDEPENDENT_AMBULATORY_CARE_PROVIDER_SITE_OTHER): Payer: Self-pay | Admitting: PREVENTIVE MEDICINE-OCCUPATIONAL MEDICINE

## 2021-06-17 ENCOUNTER — Inpatient Hospital Stay
Admission: RE | Admit: 2021-06-17 | Discharge: 2021-06-17 | Disposition: A | Discharge status: Home / Self Care | Payer: 59 | Source: Ambulatory Visit | Attending: PREVENTIVE MEDICINE-OCCUPATIONAL MEDICINE | Admitting: PREVENTIVE MEDICINE-OCCUPATIONAL MEDICINE

## 2021-06-17 ENCOUNTER — Other Ambulatory Visit: Payer: Self-pay

## 2021-06-17 DIAGNOSIS — R7612 Nonspecific reaction to cell mediated immunity measurement of gamma interferon antigen response without active tuberculosis: Secondary | ICD-10-CM | POA: Insufficient documentation

## 2022-02-01 ENCOUNTER — Other Ambulatory Visit (HOSPITAL_BASED_OUTPATIENT_CLINIC_OR_DEPARTMENT_OTHER): Payer: Self-pay | Admitting: INTERNAL MEDICINE

## 2022-02-01 DIAGNOSIS — M25552 Pain in left hip: Secondary | ICD-10-CM

## 2022-02-02 ENCOUNTER — Inpatient Hospital Stay
Admission: RE | Admit: 2022-02-02 | Discharge: 2022-02-02 | Disposition: A | Discharge status: Home / Self Care | Payer: 59 | Source: Ambulatory Visit | Attending: INTERNAL MEDICINE | Admitting: INTERNAL MEDICINE

## 2022-02-02 ENCOUNTER — Other Ambulatory Visit: Payer: Self-pay

## 2022-02-02 DIAGNOSIS — M25552 Pain in left hip: Secondary | ICD-10-CM | POA: Insufficient documentation

## 2022-02-02 DIAGNOSIS — R936 Abnormal findings on diagnostic imaging of limbs: Secondary | ICD-10-CM

## 2022-02-18 ENCOUNTER — Encounter (INDEPENDENT_AMBULATORY_CARE_PROVIDER_SITE_OTHER): Payer: Self-pay | Admitting: Student in an Organized Health Care Education/Training Program

## 2022-02-18 ENCOUNTER — Ambulatory Visit (INDEPENDENT_AMBULATORY_CARE_PROVIDER_SITE_OTHER): Payer: 59 | Admitting: Student in an Organized Health Care Education/Training Program

## 2022-02-18 ENCOUNTER — Other Ambulatory Visit: Payer: Self-pay

## 2022-02-18 VITALS — BP 115/73 | HR 74 | Temp 97.6°F | Ht 72.0 in | Wt 210.7 lb

## 2022-02-18 DIAGNOSIS — R7612 Nonspecific reaction to cell mediated immunity measurement of gamma interferon antigen response without active tuberculosis: Secondary | ICD-10-CM

## 2022-02-18 DIAGNOSIS — Z227 Latent tuberculosis: Secondary | ICD-10-CM

## 2022-02-18 DIAGNOSIS — M25552 Pain in left hip: Secondary | ICD-10-CM

## 2022-02-18 NOTE — Progress Notes (Signed)
[facility]  FAMILY MEDICINE, [name]  [place] [address]  [phone]          NAME: Kirk Gonzalez  DOB:  06/16/1995  AGE:  27 y.o.  MRN:  [id number]    APPT:  02/18/2022  7:45 AM EST    Chief Complaint: Hip Pain (Needs PT referral /)    History of Present Illness:  Kirk Gonzalez is a 27 y.o. male who presents as an established patient to the Family Medicine clinic at [facility]. Patient had positive Quantiferon through employee health at [facility] in July, negative CXR. States to date he hasn't had repeat blood work or TB testing completed.   Was never treated for latent TB. I don't have access to these records through employee health. Patient is a resident in internal medicine, states he was in [place] last year. No known exposures to TB. He denies night sweats, no respiratory symptoms, no weight loss.     C/o of hip pain for several months. 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     PHQ Questionnaire  Little interest or pleasure in doing things.: Not at all  Feeling down, depressed, or hopeless: Not at all  PHQ 2 Total: 0      04/02/2020     2:00 PM 05/08/2020     3:00 PM   GAD-7 Questionnaire   Feeling nervous,anxious,on edge 2 0   Not being able to stop or control worrying 2 0   Worrying too much about different things 0 0   Trouble relaxing 0 0   Being so restless that it is hard to sit still 0 0   Becoming easily annoyed or irritable 0 0   Feeling afraid as if something awful might happen 0 0   How difficult have these problems made it for you to work, take care of things at home, or get along with other people? Somewhat difficult Not difficult at all   Gad-7 Score Total 4 0   Interpretation 0-4, normal 0-4, normal           Past Medical History:  Patient Active Problem List   Diagnosis    Adjustment disorder     Past Surgical History:   Procedure Laterality Date    HX APPENDECTOMY       Family  Medical History:       Problem Relation (Age of Onset)    Back Problems Mother    Celiac Disease Brother    Diabetes Brother    Diabetes type I Brother    Diabetes type II Paternal Grandfather    Hypertension (High Blood Pressure) Father    Osteopenia Mother            Social History     Socioeconomic History    Marital status: Single     Spouse name: Not on file    Number of children: 0    Years of education: Not on file    Highest education level: Not on file   Occupational History    Occupation: student   Tobacco Use    Smoking status: Never    Smokeless tobacco: Never   Vaping Use    Vaping Use: Never used   Substance and Sexual Activity    Alcohol use: Yes     Alcohol/week: 0.0 standard drinks of alcohol     Comment:  social    Drug use: No    Sexual activity: Not on file   Other Topics Concern    Not on file   Social History Narrative    Not on file     Social Determinants of Health     Financial Resource Strain: Not on file   Transportation Needs: Not on file   Social Connections: Not on file   Intimate Partner Violence: Not on file   Housing Stability: Not on file     Allergies   Allergen Reactions    Nkda [No Known Drug Allergies]          Review of Systems:  Constitutional: -) fevers (-) chills. (-) weight loss. (-) fatigue.  HEENT.  (-) congestion. (-) sore throat.   Cardio: (-) chest pain. (-) palpitation.  Pulm: (-) dyspnea (on exertion) . (-) cough.   GI: (-) poor appetite. (-) abdominal pain. (-) nausea (-) vomiting.   (-) diarrhea. (-) constipation.   GU: (-) dysuria (-) Urgency. (-) Hematuria.  MS. (-) joint pain (-) extremity swelling. (-)Hip pain.   Dermatologic: (-) rashes. (-) pruritus.   Psychiatric: (-) Depression. (-) anxiety. (-) insomnia.   Neurologic: (-) headaches. (-) neuropathy. (-) weakness. (-) memory problems.   other than HPI and above, all other systems reviewed and negative.        Physical Examination:  BP 115/73   Pulse 74   Temp 36.4 C (97.6 F) (Thermal Scan)   Ht 1.829 m  (6')   Wt 95.6 kg (210 lb 11.2 oz)   SpO2 99%   BMI 28.58 kg/m   Constitutional: AF. VSS. AAOx3. NAD.   HEENT: PERRLA/EOMI/ No throat erythema or drainage/MMM.  Trachea midline.   Lungs: Nonlabored with symmetric movement. CTAB/No WRR.  Cardio: RRR/S1S2. No murmurs.  GI/GU: S/NT/ND/BS+  Neuro: CN II-XII grossly intact, normal gait.  Psychiatric: Normal mood and affect. No SI/HI  Skin: no rashes or lesions noted on exam.   MSK: normocephalic, atraumatic. ROM intact. Extremities warm and dry. Intact strength and sensation.       Results:  Office Visit on 05/30/2020   Component Date Value Ref Range Status    QUANTIFERON, QUALITATIVE 05/30/2020 Negative  Negative Final    M. tuberculosis infection NOT likely.  A negative result alone does not exclude infection with M. tuberculosis.        NIL RESULT 05/30/2020 0.03  IU/mL Final    TB1 AG MINUS NIL RESULT 05/30/2020 0.13  IU/mL Final    TB2 AG MINUS NIL RESULT 05/30/2020 0.07  IU/mL Final    MITOGEN MINUS NIL RESULT 05/30/2020 >10.00  IU/mL Final         Data Reviewed / POCT Results/ Meds Administered:  N/A          Screening/Health Maintenance:   No results found for: "CHOLESTEROL", "HDLCHOL", "LDLCHOL", "TRIG", "GLUCOSEFAST", "HA1C"   Health Maintenance: Pending and Last Completed         Date Due Completion Date    Influenza Vaccine (1) 09/11/2021 11/23/2019    Covid-19 Vaccine (4 - 2023-24 season) 09/11/2021 11/23/2019    Depression Screening 02/19/2023 02/18/2022    Adult Tdap-Td (7 - Td or Tdap) 07/05/2027 07/04/2017          Immunization History   Administered Date(s) Administered    Covid-19 Vaccine,Moderna,12 Years+ 02/09/2019, 03/09/2019, 11/23/2019    PPD(ADMIN) 07/03/2019    VARICELLA VACCINE LIVE 02/10/2015       Assessment:  Kirk Gonzalez  DIAGNOSES     ICD-10-CM   1. 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 Left hip pain  M25.552       Plan:   27 yr old male who states he had postive Quantiferon May 2023 through employee  health at [facility] prior to starting residency. Will obtain these records. CXR was negative in the chart in May 2023 around that same time. No follow up testing to date. Will discuss with ID/ refer to ID for follow up ASAP - concerning for latent TB unless there are more results we aren't aware of. To date he hasn't had treatment. Will also report to health dept if this already hasn't been done for mandatory reporting. Repeat Quantiferon placed in the chart if this is needed prior to seeing ID. Spoke to Dr. 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 Referred to PT for his hip pain, follow up with Sports med as needed.     Depression screening is negative. PHQ 2 Total: 0     If anything comes up in the mean time, I advised the patient to let me know and I would be more than happy to speak with them. If it cannot wait to please go to Urgent Care or ER. The patient was given the opportunity to ask questions and those questions were answered to the patient's satisfaction. The patient was encouraged to call with any additional questions or concerns. Instructed patient to call back if symptoms worse.   Education  Medication safety, disease management, and lifestyle management  The patient has been educated and verbalized understanding regarding the services provided during this visit.    Follow up:  Return in about 8 weeks (around 04/15/2022) for 8 weeks.    Donzetta Starch, DO 02/18/2022, 14:11  FAMILY MEDICINE, [facility]  Operated by [facility]  [address]  [place] [address]  Dept: [phone]  Dept Fax: [phone]

## 2022-02-19 ENCOUNTER — Ambulatory Visit (INDEPENDENT_AMBULATORY_CARE_PROVIDER_SITE_OTHER): Payer: Self-pay | Admitting: Internal Medicine

## 2022-02-19 NOTE — H&P (Unsigned)
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  [email or web address]           Date of Service:    02/26/2022    Name of Requesting Clinical Staff: Donzetta Starch, DO  PCP:      Donzetta Starch, DO    Reason for consultation:    Positive Quantiferon Test    HPI: I reviewed the patient's records, and integrated a summary into the history below.    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 He was born in [place] and immigrated to the [place] at the age of 28. He has never received BCG vaccine. In March 2019, he was found to have a positive Quantiferon test on hire at [facility] in the Dietary Department. He was not treated at that time. He then had a TST placed in June 2021 that was negative. His PCP checked a Quantiferon in May 2022 that was negative. 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 Follow-up CXR was negative.    Has has no known TB exposure. To this point, he has not cared for any patients with known active tuberculosis. He is completely asymptomatic, has no significant medical problems and takes no medications, except albuterol inhaler prior to exercise.     Patient Active Problem List    Diagnosis Date Noted    Adjustment disorder 05/08/2020       Past Medical History:   Diagnosis Date    Asthma        Past Surgical History:   Procedure Laterality Date    HX APPENDECTOMY         Current Outpatient Medications   Medication  Sig    albuterol sulfate (PROAIR HFA) 90 mcg/actuation Inhalation HFA Aerosol Inhaler Take 2 Puffs by inhalation Every 4 hours as needed    ascorbic acid (VITAMIN C ORAL) Take by mouth    MULTI-VITAMIN ORAL Take by mouth    rifAMPin (RIFADIN) 300 mg Oral Capsule Take 2 Capsules (600 mg total) by mouth Once a day       Allergies   Allergen Reactions    Nkda [No Known Drug Allergies]        Family Medical History:       Problem Relation (Age of Onset)    Back Problems Mother    Celiac Disease Brother    Diabetes Brother    Diabetes type I Brother    Diabetes type II Paternal Grandfather    Hypertension (High Blood Pressure) Father    Osteopenia Mother          Social History     Socioeconomic History    Marital status: Single    Number of children: 0   Occupational History    Occupation: Ship broker   Tobacco Use    Smoking status: Never    Smokeless tobacco: Never   Vaping Use  Vaping Use: Never used   Substance and Sexual Activity    Alcohol use: Yes     Alcohol/week: 0.0 standard drinks of alcohol     Comment: social    Drug use: No        Physical Exam  Constitutional Healthy appearing   Vital signs BP 116/65   Pulse 60   Temp 36.4 C (97.6 F)   Ht 1.829 m (6')   Wt 94.8 kg (208 lb 15.9 oz)   SpO2 98%   BMI 28.34 kg/m    Eyes Lids without ptosis or edema. Sclerae anicteric.    Heart Rhythm regular. No murmur, gallop or rub.   Lungs Clear to ascultation.   Abdomen Soft, non-tender. No organomegaly.     Lab Data:   WBC 7.5--PMN 56%, lymph 32%  Hgb 15.3, platelet 213  AST 37, ALT 40    On the day of the encounter, a total of  30 minutes was spent on this patient encounter including review of historical information, examination, documentation and post-visit activities. The time documented excludes procedural time.

## 2022-02-26 ENCOUNTER — Encounter (INDEPENDENT_AMBULATORY_CARE_PROVIDER_SITE_OTHER): Payer: Self-pay | Admitting: Internal Medicine

## 2022-02-26 ENCOUNTER — Other Ambulatory Visit (INDEPENDENT_AMBULATORY_CARE_PROVIDER_SITE_OTHER): Payer: 59 | Admitting: Rheumatology

## 2022-02-26 ENCOUNTER — Other Ambulatory Visit: Payer: Self-pay

## 2022-02-26 ENCOUNTER — Ambulatory Visit: Payer: 59 | Attending: Student in an Organized Health Care Education/Training Program | Admitting: Internal Medicine

## 2022-02-26 DIAGNOSIS — Z227 Latent tuberculosis: Secondary | ICD-10-CM | POA: Insufficient documentation

## 2022-02-26 DIAGNOSIS — R7612 Nonspecific reaction to cell mediated immunity measurement of gamma interferon antigen response without active tuberculosis: Secondary | ICD-10-CM | POA: Insufficient documentation

## 2022-02-26 LAB — CBC WITH DIFF
BASOPHIL #: <0.1 10*3/uL (ref <=0.20)
BASOPHIL %: 0.4 %
EOSINOPHIL #: 0.4 10*3/uL (ref <=0.50)
EOSINOPHIL %: 5.4 %
HCT: 43.6 % (ref 38.9–52.0)
HGB: 15.3 g/dL (ref 13.4–17.5)
IMMATURE GRANULOCYTE #: <0.1 10*3/uL (ref <0.10)
IMMATURE GRANULOCYTE %: 0.3 % (ref 0.0–1.0)
LYMPHOCYTE #: 2.41 10*3/uL (ref 1.00–4.80)
LYMPHOCYTE %: 32.3 %
MCH: 28.9 pg (ref 26.0–32.0)
MCHC: 35.1 g/dL (ref 31.0–35.5)
MCV: 82.3 fL (ref 78.0–100.0)
MONOCYTE #: 0.44 10*3/uL (ref 0.20–1.10)
MONOCYTE %: 5.9 %
MPV: 9.7 fL (ref 8.7–12.5)
NEUTROPHIL #: 4.17 10*3/uL (ref 1.50–7.70)
NEUTROPHIL %: 55.7 %
PLATELETS: 213 10*3/uL (ref 150–400)
RBC: 5.3 10*6/uL (ref 4.50–6.10)
RDW-CV: 11.6 % (ref 11.5–15.5)
WBC: 7.5 10*3/uL (ref 3.7–11.0)

## 2022-02-26 LAB — ALT (SGPT): ALT (SGPT): 40 U/L (ref 10–55)

## 2022-02-26 LAB — AST (SGOT): AST (SGOT): 37 U/L (ref 8–45)

## 2022-02-26 MED ORDER — RIFAMPIN 300 MG CAPSULE
600.0000 mg | ORAL_CAPSULE | Freq: Every day | ORAL | 3 refills | Status: AC
Start: 2022-02-26 — End: 2022-06-27
  Filled 2022-02-26: qty 60, 30d supply, fill #0
  Filled 2022-03-23: qty 60, 30d supply, fill #1
  Filled 2022-04-28: qty 60, 30d supply, fill #2
  Filled 2022-05-27 (×2): qty 30, 15d supply, fill #3

## 2022-03-23 ENCOUNTER — Other Ambulatory Visit: Payer: Self-pay

## 2022-03-26 ENCOUNTER — Other Ambulatory Visit: Payer: Self-pay

## 2022-03-26 ENCOUNTER — Other Ambulatory Visit: Payer: 59 | Attending: Internal Medicine | Admitting: Rheumatology

## 2022-03-26 DIAGNOSIS — Z227 Latent tuberculosis: Secondary | ICD-10-CM | POA: Insufficient documentation

## 2022-03-26 LAB — CBC WITH DIFF
BASOPHIL #: <0.1 10*3/uL (ref <=0.20)
BASOPHIL %: 0.7 %
EOSINOPHIL #: 0.3 10*3/uL (ref <=0.50)
EOSINOPHIL %: 5.2 %
HCT: 41.2 % (ref 38.9–52.0)
HGB: 14.9 g/dL (ref 13.4–17.5)
IMMATURE GRANULOCYTE #: <0.1 10*3/uL (ref <0.10)
IMMATURE GRANULOCYTE %: 0.2 % (ref 0.0–1.0)
LYMPHOCYTE #: 2.23 10*3/uL (ref 1.00–4.80)
LYMPHOCYTE %: 39 %
MCH: 29.6 pg (ref 26.0–32.0)
MCHC: 36.2 g/dL — ABNORMAL HIGH (ref 31.0–35.5)
MCV: 81.7 fL (ref 78.0–100.0)
MONOCYTE #: 0.4 10*3/uL (ref 0.20–1.10)
MONOCYTE %: 7 %
MPV: 9.6 fL (ref 8.7–12.5)
NEUTROPHIL #: 2.74 10*3/uL (ref 1.50–7.70)
NEUTROPHIL %: 47.9 %
PLATELETS: 210 10*3/uL (ref 150–400)
RBC: 5.04 10*6/uL (ref 4.50–6.10)
RDW-CV: 11.7 % (ref 11.5–15.5)
WBC: 5.7 10*3/uL (ref 3.7–11.0)

## 2022-03-26 LAB — AST (SGOT): AST (SGOT): 36 U/L (ref 8–45)

## 2022-03-26 LAB — ALT (SGPT): ALT (SGPT): 58 U/L — ABNORMAL HIGH (ref 10–55)

## 2022-03-29 ENCOUNTER — Other Ambulatory Visit (INDEPENDENT_AMBULATORY_CARE_PROVIDER_SITE_OTHER): Payer: Self-pay | Admitting: Internal Medicine

## 2022-03-29 DIAGNOSIS — Z227 Latent tuberculosis: Secondary | ICD-10-CM

## 2022-04-28 ENCOUNTER — Other Ambulatory Visit: Payer: Self-pay

## 2022-05-27 ENCOUNTER — Other Ambulatory Visit: Payer: Self-pay

## 2022-05-28 ENCOUNTER — Other Ambulatory Visit: Payer: Self-pay

## 2022-05-31 ENCOUNTER — Other Ambulatory Visit: Payer: Self-pay

## 2022-06-09 ENCOUNTER — Other Ambulatory Visit (HOSPITAL_COMMUNITY): Payer: Self-pay

## 2022-06-09 DIAGNOSIS — Z113 Encounter for screening for infections with a predominantly sexual mode of transmission: Secondary | ICD-10-CM

## 2022-06-10 ENCOUNTER — Other Ambulatory Visit: Payer: Self-pay

## 2022-06-10 ENCOUNTER — Other Ambulatory Visit: Payer: 59 | Attending: Student in an Organized Health Care Education/Training Program | Admitting: Rheumatology

## 2022-06-10 DIAGNOSIS — Z113 Encounter for screening for infections with a predominantly sexual mode of transmission: Secondary | ICD-10-CM | POA: Insufficient documentation

## 2022-06-10 LAB — HIV1/HIV2 SCREEN, COMBINED ANTIGEN AND ANTIBODY: HIV SCREEN, COMBINED ANTIGEN & ANTIBODY: NEGATIVE

## 2022-06-10 LAB — SYPHILIS SCREENING ALGORITHM WITH REFLEX, SERUM: SYPHILIS TP ANTIBODIES: NONREACTIVE

## 2022-06-11 LAB — CHLAMYDIA TRACHOMATIS/NEISSERIA GONORRHOEAE RNA, NAAT
CHLAMYDIA TRACHOMATIS RNA: NEGATIVE
NEISSERIA GONORRHEA GC RNA: NEGATIVE
